# Patient Record
Sex: Male | Born: 1950 | Race: Black or African American | Hispanic: No | Marital: Single | Smoking: Never smoker
Health system: Southern US, Community
[De-identification: ages and names within clinical notes are randomized; demographics above are authoritative.]

## PROBLEM LIST (undated history)

## (undated) DIAGNOSIS — I10 Essential (primary) hypertension: Secondary | ICD-10-CM

## (undated) DIAGNOSIS — I82561 Chronic embolism and thrombosis of right calf muscular vein: Secondary | ICD-10-CM

## (undated) DIAGNOSIS — Z86711 Personal history of pulmonary embolism: Secondary | ICD-10-CM

## (undated) DIAGNOSIS — E119 Type 2 diabetes mellitus without complications: Secondary | ICD-10-CM

## (undated) DIAGNOSIS — D6861 Antiphospholipid syndrome: Secondary | ICD-10-CM

## (undated) HISTORY — DX: Type 2 diabetes mellitus without complications: E11.9

## (undated) HISTORY — DX: Essential (primary) hypertension: I10

## (undated) HISTORY — DX: Antiphospholipid syndrome: D68.61

## (undated) HISTORY — DX: Personal history of pulmonary embolism: Z86.711

## (undated) HISTORY — DX: Chronic embolism and thrombosis of right calf muscular vein: I82.561

---

## 2013-08-31 HISTORY — PX: HERNIA REPAIR: SHX51

## 2013-08-31 HISTORY — PX: CHOLECYSTECTOMY: SHX55

## 2021-02-11 DIAGNOSIS — I2694 Multiple subsegmental pulmonary emboli without acute cor pulmonale: Secondary | ICD-10-CM

## 2021-02-11 HISTORY — DX: Multiple subsegmental thrombotic pulmonary emboli without acute cor pulmonale: I26.94

## 2021-06-11 ENCOUNTER — Ambulatory Visit (INDEPENDENT_AMBULATORY_CARE_PROVIDER_SITE_OTHER): Payer: Self-pay | Admitting: Student

## 2021-06-11 ENCOUNTER — Encounter: Payer: Self-pay | Admitting: Student

## 2021-06-11 ENCOUNTER — Other Ambulatory Visit: Payer: Self-pay

## 2021-06-11 VITALS — BP 133/91 | HR 68 | Temp 98.3°F | Resp 32 | Ht 66.0 in | Wt 198.7 lb

## 2021-06-11 DIAGNOSIS — I10 Essential (primary) hypertension: Secondary | ICD-10-CM | POA: Insufficient documentation

## 2021-06-11 DIAGNOSIS — I519 Heart disease, unspecified: Secondary | ICD-10-CM

## 2021-06-11 DIAGNOSIS — D6861 Antiphospholipid syndrome: Secondary | ICD-10-CM

## 2021-06-11 DIAGNOSIS — S8991XS Unspecified injury of right lower leg, sequela: Secondary | ICD-10-CM

## 2021-06-11 DIAGNOSIS — Z23 Encounter for immunization: Secondary | ICD-10-CM

## 2021-06-11 DIAGNOSIS — I2510 Atherosclerotic heart disease of native coronary artery without angina pectoris: Secondary | ICD-10-CM | POA: Insufficient documentation

## 2021-06-11 DIAGNOSIS — E119 Type 2 diabetes mellitus without complications: Secondary | ICD-10-CM

## 2021-06-11 NOTE — Progress Notes (Signed)
CC: New patient to establish PCP  HPI:  Justin Mueller is a 70 y.o. male with history listed below presenting to the [facility] to establish care. Please see individualized problem based charting for full HPI.  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 Swallow whole.     atorvastatin (LIPITOR) 80 MG tablet Take 80 mg by mouth daily.     lisinopril (ZESTRIL) 10 MG tablet Take 10 mg by mouth daily.     metFORMIN (GLUCOPHAGE) 1000 MG tablet Take 1,000 mg by mouth 2 (two) times daily with a meal.     warfarin (COUMADIN) 7.5 MG tablet Take 7.5 mg by mouth daily.     No current facility-administered medications on file prior to visit.   No Known Allergies  Family History  Problem Relation Age of Onset   Cervical cancer Mother    Heart disease Father    Prostate cancer Father    Diabetes Sister    Social History   Socioeconomic History   Marital status: Single    Spouse name: Not on file   Number of children: Not on file   Years of education: Not on file   Highest education level: Not on file  Occupational History   Occupation: Retired, formerly worked at [employer]  Tobacco Use   Smoking status: Never   Smokeless tobacco: Never  Substance and Sexual Activity   Alcohol use: Yes    Comment: occasional   Drug use: Never   Sexual activity: Not on file  Other Topics Concern   Not on file  Social History Narrative   Recently moved from [place]. Lives by himself, but has a  cousin that lives close by.    Social Determinants of Health   Financial Resource Strain: Not on file  Food Insecurity: Not on file  Transportation Needs: Not on file  Physical Activity: Not on file  Stress: Not on file  Social Connections: Not on file  Intimate Partner Violence: Not on file    Review of Systems:  Negative aside from that listed in individualized problem based charting.  Physical Exam:  Vitals:   06/11/21 1419  BP: (!) 133/91  Pulse: 68  Resp: (!) 32  Temp: 98.3 F (36.8 C)  TempSrc: Oral  SpO2: (!) 67%  Weight: 198 lb 11.2 oz (90.1 kg)  Height: 5\' 6"  (1.676 m)   Physical Exam Constitutional:      Appearance: Normal appearance. He is not ill-appearing.  HENT:     Nose: Nose normal. No congestion.     Mouth/Throat:     Mouth: Mucous membranes are moist.     Pharynx: Oropharynx is clear. No oropharyngeal exudate.  Cardiovascular:     Rate and Rhythm: Normal rate and regular rhythm.     Pulses: Normal pulses.     Heart sounds: Normal heart sounds. No murmur heard.   No friction rub. No gallop.  Pulmonary:  Effort: Pulmonary effort is normal.     Breath sounds: Normal breath sounds. No wheezing, rhonchi or rales.  Abdominal:     General: Bowel sounds are normal. There is no distension.     Palpations: Abdomen is soft.     Tenderness: There is no abdominal tenderness.  Musculoskeletal:        General: No swelling or tenderness. Normal range of motion.     Cervical back: Normal range of motion. No rigidity.     Comments: Wearing right knee brace  Skin:    General: Skin is warm and dry.  Neurological:     General: No focal deficit present.     Mental Status: He is alert and oriented to person, place, and time.  Psychiatric:        Mood and Affect: Mood normal.        Behavior: Behavior normal.     Assessment & Plan:   See Encounters Tab for problem based charting.  Patient discussed with Dr. Heide Spark

## 2021-06-11 NOTE — Assessment & Plan Note (Signed)
Patient states that he was told he has heart disease in the past and has required a stent placement in his right arm for a clot. Not much extra information was able to be obtained. He is currently taking aspirin 81mg  and lipitor 80mg  daily, likely for CAD. He is requesting a referral to cardiology as he followed one every 6 months for his heart disease. I believe this is appropriate.  Plan: -continue ASA and lipitor -referral to cardiology -awaiting medical records from prior PCP office

## 2021-06-11 NOTE — Patient Instructions (Signed)
Justin Mueller,  It was a pleasure seeing you in the clinic today.   I have placed a referral to cardiology. Someone will call you to set this up. We are working to get you into the coumadin clinic. Someone will you to schedule your appointment. We will be requesting your health information from your last clinic in [place].  Please come back in 1 month for follow up.  Please call our clinic at [phone] if you have any questions or concerns. The best time to call is Monday-Friday from 9am-4pm, but there is someone available 24/7 at the same number. If you need medication refills, please notify your pharmacy one week in advance and they will send [place] a request.   Thank you for letting us take part in your care. We look forward to seeing you next time!

## 2021-06-11 NOTE — Assessment & Plan Note (Signed)
Patient with history of APS, unsure which antibodies were positive as we do not yet have records from patient's prior PCP office. However, he does have a history of chronic RLE DVT and a recent PE (01/2021) that are likely sequelae from his APS. He did a lovenox bridge to coumadin and has been on coumadin since. He followed a coumadin clinic in the past, but would like to switch to one here. Will reach out to Dr. Alexandria Lodge to schedule patient for a visit soon.  Plan: -continue coumadin -reach out to Dr. Alexandria Lodge for coumadin clinic -awaiting medical records from prior PCP office

## 2021-06-11 NOTE — Assessment & Plan Note (Signed)
Patient reports that he had a fall in the past after which he was diagnosed with a right quad tear s/p repair. States after repair, they also found that he had a fracture on right side of right knee. It does not cause any pain with ambulation anymore, but he still feels it buckle from time to time. Does mention wanting an ortho referral for further evaluation, but discussed awaiting records from prior PCP for more information. In the meantime, will refer to physical therapy for strength training.  Plan: -referral to PT -awaiting medical records from prior PCP office

## 2021-06-11 NOTE — Assessment & Plan Note (Signed)
Vitals:   06/11/21 1419  BP: (!) 133/91   Patient with history of HTN, on lisinopril 10mg  daily. BP slightly elevated but states BP is typically good at home (120s/80s). Will avoid changing medications today.  Plan: -continue lisinopril 10mg  daily -awaiting medical records from prior PCP office

## 2021-06-11 NOTE — Assessment & Plan Note (Signed)
Patient with history of T2DM without complications, on metformin 1000mg  BID. Reports that his blood sugars have been well controlled and has never needed insulin or been hospitalized for diabetes. States that he had an A1c recently that was at a good level (unsure exact number). Will plan to obtain repeat A1c at next visit in 1 month.  Plan: -continue metformin 1000mg  BID -awaiting medical records from prior PCP office -A1c at next visit for baseline

## 2021-06-12 NOTE — Progress Notes (Signed)
Internal Medicine Clinic Attending  Case discussed with Dr. Jinwala  At the time of the visit.  We reviewed the resident's history and exam and pertinent patient test results.  I agree with the assessment, diagnosis, and plan of care documented in the resident's note.  

## 2021-06-22 ENCOUNTER — Encounter (HOSPITAL_COMMUNITY): Payer: Self-pay | Admitting: Emergency Medicine

## 2021-06-22 ENCOUNTER — Emergency Department (HOSPITAL_COMMUNITY): Payer: Medicare Other

## 2021-06-22 ENCOUNTER — Other Ambulatory Visit: Payer: Self-pay

## 2021-06-22 ENCOUNTER — Emergency Department (HOSPITAL_COMMUNITY)
Admission: EM | Admit: 2021-06-22 | Discharge: 2021-06-23 | Disposition: A | Discharge status: Home / Self Care | Payer: Medicare Other | Attending: Emergency Medicine | Admitting: Emergency Medicine

## 2021-06-22 DIAGNOSIS — R059 Cough, unspecified: Secondary | ICD-10-CM | POA: Diagnosis not present

## 2021-06-22 DIAGNOSIS — R42 Dizziness and giddiness: Secondary | ICD-10-CM | POA: Insufficient documentation

## 2021-06-22 DIAGNOSIS — R519 Headache, unspecified: Secondary | ICD-10-CM | POA: Insufficient documentation

## 2021-06-22 DIAGNOSIS — Z5321 Procedure and treatment not carried out due to patient leaving prior to being seen by health care provider: Secondary | ICD-10-CM | POA: Diagnosis not present

## 2021-06-22 DIAGNOSIS — R0981 Nasal congestion: Secondary | ICD-10-CM | POA: Diagnosis not present

## 2021-06-22 LAB — CBC WITH DIFFERENTIAL/PLATELET
Abs Immature Granulocytes: 0.02 10*3/uL (ref 0.00–0.07)
Basophils Absolute: 0 10*3/uL (ref 0.0–0.1)
Basophils Relative: 0 %
Eosinophils Absolute: 0.1 10*3/uL (ref 0.0–0.5)
Eosinophils Relative: 1 %
HCT: 46.1 % (ref 39.0–52.0)
Hemoglobin: 15.4 g/dL (ref 13.0–17.0)
Immature Granulocytes: 0 %
Lymphocytes Relative: 19 %
Lymphs Abs: 1.2 10*3/uL (ref 0.7–4.0)
MCH: 30.9 pg (ref 26.0–34.0)
MCHC: 33.4 g/dL (ref 30.0–36.0)
MCV: 92.4 fL (ref 80.0–100.0)
Monocytes Absolute: 0.7 10*3/uL (ref 0.1–1.0)
Monocytes Relative: 10 %
Neutro Abs: 4.5 10*3/uL (ref 1.7–7.7)
Neutrophils Relative %: 70 %
Platelets: 213 10*3/uL (ref 150–400)
RBC: 4.99 MIL/uL (ref 4.22–5.81)
RDW: 13.4 % (ref 11.5–15.5)
WBC: 6.5 10*3/uL (ref 4.0–10.5)
nRBC: 0 % (ref 0.0–0.2)

## 2021-06-22 LAB — COMPREHENSIVE METABOLIC PANEL
ALT: 21 U/L (ref 0–44)
AST: 21 U/L (ref 15–41)
Albumin: 3.5 g/dL (ref 3.5–5.0)
Alkaline Phosphatase: 51 U/L (ref 38–126)
Anion gap: 13 (ref 5–15)
BUN: 23 mg/dL (ref 8–23)
CO2: 22 mmol/L (ref 22–32)
Calcium: 9.2 mg/dL (ref 8.9–10.3)
Chloride: 103 mmol/L (ref 98–111)
Creatinine, Ser: 1.46 mg/dL — ABNORMAL HIGH (ref 0.61–1.24)
GFR, Estimated: 52 mL/min — ABNORMAL LOW (ref >60)
Glucose, Bld: 101 mg/dL — ABNORMAL HIGH (ref 70–99)
Potassium: 4.4 mmol/L (ref 3.5–5.1)
Sodium: 138 mmol/L (ref 135–145)
Total Bilirubin: 1 mg/dL (ref 0.3–1.2)
Total Protein: 7.7 g/dL (ref 6.5–8.1)

## 2021-06-22 LAB — MAGNESIUM: Magnesium: 1.7 mg/dL (ref 1.7–2.4)

## 2021-06-22 NOTE — ED Triage Notes (Signed)
Reports headache above his right temple X7 days.  Today he has had two episodes of dizziness that lasted about 5 minutes. No neuro deficits in triage.  Pt is on blood thinners after having a hx of "blood clots".

## 2021-06-22 NOTE — ED Provider Notes (Signed)
Emergency Medicine Provider Triage Evaluation Note  Justin Mueller , a 70 y.o. male  was evaluated in triage.  Pt complains of headache with dizziness, nasal congestion, and productive cough.  Patient reports that he has had a right-sided headache constantly over the last week.  Denies any aggravating factors.  Patient states that he has had nasal congestion and reactive cough for last week as well.  Today he had 2 episodes of dizziness.  Each episode last approximate 5 minutes.  Patient felt entire room was spinning and felt lightheaded.    Review of Systems  Positive: Headache, nasal congestion, productive cough, dizziness, lightheadedness Negative: Chest pain, palpitations, leg swelling, shortness of breath, fevers, chills, visual disturbance, numbness, weakness, facial asymmetry, dysarthria  Physical Exam  BP (!) 149/88 (BP Location: Right Arm)   Pulse 89   Temp 98.7 F (37.1 C)   Resp 20   SpO2 99%  Gen:   Awake, no distress   Resp:  Normal effort, lungs clear to auscultation bilaterally MSK:   Moves extremities without difficulty  Other:  No dysarthria.  +5 strength to bilateral upper and lower extremities.  Medical Decision Making  Medically screening exam initiated at 8:40 PM.  Appropriate orders placed.  Voshon Petro was informed that the remainder of the evaluation will be completed by another provider, this initial triage assessment does not replace that evaluation, and the importance of remaining in the ED until their evaluation is complete.     Berneice Heinrich 06/22/21 2041    Jacalyn Lefevre, MD 06/22/21 2056

## 2021-06-23 ENCOUNTER — Ambulatory Visit (INDEPENDENT_AMBULATORY_CARE_PROVIDER_SITE_OTHER): Payer: Medicare Other | Admitting: Student

## 2021-06-23 ENCOUNTER — Encounter: Payer: Self-pay | Admitting: Student

## 2021-06-23 ENCOUNTER — Other Ambulatory Visit: Payer: Self-pay

## 2021-06-23 DIAGNOSIS — J069 Acute upper respiratory infection, unspecified: Secondary | ICD-10-CM | POA: Diagnosis present

## 2021-06-23 DIAGNOSIS — I10 Essential (primary) hypertension: Secondary | ICD-10-CM

## 2021-06-23 HISTORY — DX: Acute upper respiratory infection, unspecified: J06.9

## 2021-06-23 NOTE — ED Notes (Signed)
Pt left due to wait time  

## 2021-06-23 NOTE — Progress Notes (Signed)
   CC: congestion and headache over the past week  HPI:  Justin Mueller is a 70 y.o. male with history listed below presenting to the [facility] for congestion and headache over the past week. Please see individualized problem based charting for full HPI.  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  Physical Exam:  Vitals:   06/23/21 1340  BP: 116/88  Pulse: 95  Resp: (!) 28  Temp: 98.9 F (37.2 C)  TempSrc: Oral  SpO2: 97%  Weight: 193 lb 14.4 oz (88 kg)  Height: 5\' 6"  (1.676 m)   Physical Exam Constitutional:      Appearance: He is obese. He is not ill-appearing.  HENT:     Mouth/Throat:     Mouth: Mucous membranes are moist.     Pharynx: Oropharynx is clear.  Eyes:     Extraocular Movements: Extraocular movements intact.     Pupils: Pupils are equal, round, and reactive to light.  Cardiovascular:     Rate and Rhythm: Normal rate and regular rhythm.     Heart sounds: Normal heart sounds. No murmur heard. Pulmonary:     Effort: Pulmonary effort is normal.     Breath sounds: Normal breath sounds. No wheezing, rhonchi or rales.  Musculoskeletal:        General: No swelling. Normal range of motion.     Cervical back: Normal range of motion.  Skin:    General: Skin is warm and dry.  Neurological:     Mental Status: He is alert and oriented to person, place, and time.  Psychiatric:        Mood and Affect: Mood normal.        Behavior: Behavior normal.     Assessment & Plan:   See Encounters Tab for problem based charting.  Patient discussed with Dr. 

## 2021-06-23 NOTE — Assessment & Plan Note (Signed)
Vitals:   06/23/21 1340  BP: 116/88   Patient's HTN well controlled today on lisinopril 10mg  daily. He was able to bring some of his medical records so will need to scan these in.  Plan: -continue lisinopril

## 2021-06-23 NOTE — Patient Instructions (Signed)
Mr. Justin Mueller,  It was a pleasure seeing you in the clinic today.   Please make sure to stay adequately hydrated. Try to drink enough water to keep your urine clear to very light yellow. You can pick up Mucinex or Mucinex-DM from any pharmacy and use that to help with your congestion. Take tylenol as needed for you headache. Please call us if your symptoms have not improved within the next 2-3 weeks.  Please call our clinic at [phone] if you have any questions or concerns. The best time to call is Monday-Friday from 9am-4pm, but there is someone available 24/7 at the same number. If you need medication refills, please notify your pharmacy one week in advance and they will send [place] a request.   Thank you for letting us take part in your care. We look forward to seeing you next time!

## 2021-06-23 NOTE — Assessment & Plan Note (Signed)
Justin Mueller reports having a two week history of congestion with intermittent headaches that began a couple of days after receiving his flu shot. States that it was bad about a week ago as he was expectorating globs of mucus, but it has been gradually improving now. Reports one day last week where he experienced a fever and chills, but none since that time (did not check temperature at that time). States that he went to the ED recently for evaluation as he was concerned, but CBC and CMP unremarkable aside from mild kidney dysfunction likely 2/2 decreased PO intake. He also mentions that he has been having intermittent right sided headaches.  Advised patient that his symptoms are likely from a viral URI and that they will resolve spontaneously over the next couple of weeks. Advised symptomatic management as follows: tylenol prn for headaches, mucinex for congestion. Also advised Justin Mueller to increase his PO intake, especially in regards to hydration with water. He states his urine has typically been yellow. Advised him to drink enough water that urine remains clear to light yellow to ensure he is adequately hydrated. He confirms understanding.  Plan: -tylenol prn, mucinex, oral hydration -contact clinic in 2-3 weeks should symptoms worsen or fail to improve

## 2021-06-24 NOTE — Progress Notes (Signed)
Internal Medicine Clinic Attending  Case discussed with Dr. Jinwala  At the time of the visit.  We reviewed the resident's history and exam and pertinent patient test results.  I agree with the assessment, diagnosis, and plan of care documented in the resident's note.  

## 2021-07-08 ENCOUNTER — Other Ambulatory Visit: Payer: Self-pay

## 2021-07-08 ENCOUNTER — Ambulatory Visit: Payer: Self-pay

## 2021-07-08 ENCOUNTER — Ambulatory Visit (INDEPENDENT_AMBULATORY_CARE_PROVIDER_SITE_OTHER): Payer: Medicare Other | Admitting: Orthopaedic Surgery

## 2021-07-08 ENCOUNTER — Encounter: Payer: Self-pay | Admitting: Orthopaedic Surgery

## 2021-07-08 DIAGNOSIS — M25561 Pain in right knee: Secondary | ICD-10-CM | POA: Diagnosis not present

## 2021-07-08 DIAGNOSIS — G8929 Other chronic pain: Secondary | ICD-10-CM | POA: Diagnosis not present

## 2021-07-08 DIAGNOSIS — S82001S Unspecified fracture of right patella, sequela: Secondary | ICD-10-CM

## 2021-07-08 NOTE — Progress Notes (Signed)
Office Visit Note   Patient: Justin Mueller           Date of Birth: [birth year]           MRN: [id number] Visit Date: 07/08/2021              Requested by: Justin Puma, MD [address] [place],  [place] [address] PCP: Justin Puma, MD   Assessment & Plan: Visit Diagnoses:  1. Chronic pain of right knee     Plan: I reviewed the x-rays in detail and my impression is that the quadriceps has pulled back slightly due to the periprosthetic fracture and the failure of fixation from the flip buttons.  However this is a chronic issue and at this point I do not feel that reattaching the quadriceps is really an option that would significantly improve his complaints or symptoms.  He is not symptomatic from the patella fracture so I do not recommend partial patellectomy.  I think the main problem is just decreased strength from an abnormal extensor mechanism which I think would best be treated with a course of outpatient physical therapy.  He is in agreement with this.  I will make a referral.  Follow-up as needed.  Follow-Up Instructions: No follow-ups on file.   Orders:  Orders Placed This Encounter  Procedures   XR KNEE 3 VIEW RIGHT   Ambulatory referral to Physical Therapy   No orders of the defined types were placed in this encounter.     Procedures: No procedures performed   Clinical Data: No additional findings.   Subjective: Chief Complaint  Patient presents with   Right Knee - Pain    [name] is a 70 year old gentleman comes in for evaluation of right knee weakness and decreased endurance.  He is a relatively poor historian.  He states that he had a quadriceps repair at least 10 months ago in [place] and at some point in the postoperative period he had a fall directly onto the knee which caused a fracture of the patella through the bone tunnels and was then told by the surgeon that he needed this to be repaired.  For an unknown reason he did not undergo surgery and then  moved to [place] with a friend for 6 months and then finally settled here in [place].  He states that he does not have pain just soreness to the anterior aspect of the knee.  He notices weakness and trouble with using steps.  He feels like the knee wants to buckle backwards.   Review of Systems  Constitutional: Negative.   All other systems reviewed and are negative.   Objective: Vital Signs: There were no vitals taken for this visit.  Physical Exam Vitals and nursing note reviewed.  Constitutional:      Appearance: He is well-developed.  Pulmonary:     Effort: Pulmonary effort is normal.  Abdominal:     Palpations: Abdomen is soft.  Skin:    General: Skin is warm.  Neurological:     Mental Status: He is alert and oriented to person, place, and time.  Psychiatric:        Behavior: Behavior normal.        Thought Content: Thought content normal.        Judgment: Judgment normal.    Ortho Exam  Right knee shows a fully healed midline surgical scar.  No joint effusion.  Small defect between the superior patella and the distal quadriceps.  His range of  motion is excellent and he does not have any extensor lag.  He does not have any bony crepitus or tenderness to the fracture sites.  Patella tracking is normal.  He does have 4 out of 5 strength with resisted knee extension.  Specialty Comments:  No specialty comments available.  Imaging: XR KNEE 3 VIEW RIGHT  Result Date: 07/08/2021 2 metallic buttons within the patella.  There is evidence of fracture through the bone tunnels.    PMFS History: Patient Active Problem List   Diagnosis Date Noted   Viral URI 06/23/2021   Hypertension 06/11/2021   T2DM (type 2 diabetes mellitus) (HCC) 06/11/2021   APS (antiphospholipid syndrome) (HCC) 06/11/2021   Right knee injury, sequela 06/11/2021   Heart disease 06/11/2021   Past Medical History:  Diagnosis Date   APS (antiphospholipid syndrome) (HCC)    Chronic deep vein  thrombosis (DVT) of calf muscle vein of right lower extremity (HCC)    History of pulmonary embolus (PE)    HTN (hypertension)    T2DM (type 2 diabetes mellitus) (HCC)     Family History  Problem Relation Age of Onset   Cervical cancer Mother    Heart disease Father    Prostate cancer Father    Diabetes Sister     Past Surgical History:  Procedure Laterality Date   CHOLECYSTECTOMY  2015   HERNIA REPAIR Bilateral 2015   laparoscopic   Social History   Occupational History   Occupation: Retired, formerly worked at [employer]  Tobacco Use   Smoking status: Never   Smokeless tobacco: Never  Substance and Sexual Activity   Alcohol use: Yes    Comment: occasional   Drug use: Never   Sexual activity: Not on file

## 2021-07-16 ENCOUNTER — Encounter: Payer: Medicare Other | Admitting: Student

## 2021-07-17 ENCOUNTER — Ambulatory Visit: Payer: Medicare Other | Admitting: Physical Therapy

## 2021-07-29 ENCOUNTER — Ambulatory Visit (INDEPENDENT_AMBULATORY_CARE_PROVIDER_SITE_OTHER): Payer: Medicare Other | Admitting: Internal Medicine

## 2021-07-29 ENCOUNTER — Telehealth: Payer: Self-pay

## 2021-07-29 ENCOUNTER — Encounter: Payer: Self-pay | Admitting: Internal Medicine

## 2021-07-29 ENCOUNTER — Other Ambulatory Visit: Payer: Self-pay

## 2021-07-29 VITALS — BP 114/78 | HR 78 | Ht 66.0 in | Wt 204.0 lb

## 2021-07-29 DIAGNOSIS — E785 Hyperlipidemia, unspecified: Secondary | ICD-10-CM | POA: Diagnosis not present

## 2021-07-29 DIAGNOSIS — Z7901 Long term (current) use of anticoagulants: Secondary | ICD-10-CM | POA: Insufficient documentation

## 2021-07-29 DIAGNOSIS — Z5181 Encounter for therapeutic drug level monitoring: Secondary | ICD-10-CM | POA: Diagnosis not present

## 2021-07-29 DIAGNOSIS — Z86711 Personal history of pulmonary embolism: Secondary | ICD-10-CM | POA: Diagnosis not present

## 2021-07-29 LAB — PROTIME-INR
INR: 2.2 — ABNORMAL HIGH (ref 0.9–1.2)
Prothrombin Time: 21.5 s — ABNORMAL HIGH (ref 9.1–12.0)

## 2021-07-29 NOTE — Patient Instructions (Addendum)
Medication Instructions:  Your physician recommends that you continue on your current medications as directed. Please refer to the Current Medication list given to you today.  *If you need a refill on your cardiac medications before your next appointment, please call your pharmacy*   Lab Work: TODAY: Lipomed, lipoprotein, uric acid , and INR If you have labs (blood work) drawn today and your tests are completely normal, you will receive your results only by: MyChart Message (if you have MyChart) OR A paper copy in the mail If you have any lab test that is abnormal or we need to change your treatment, we will call you to review the results.   Testing/Procedures: Your physician has referred you to see our Coumadin Clinic.  Your physician has requested that you have a right lower leg venous duplex. This test is an ultrasound of the veins in the legs or arms. It looks at venous blood flow that carries blood from the heart to the legs. Allow one hour for a Lower Venous exam.  There are no restrictions or special instructions.   Follow-Up:  To be determined   At [facility], you and your health needs are our priority.  As part of our continuing mission to provide you with exceptional heart care, we have created designated Provider Care Teams.  These Care Teams include your primary Cardiologist (physician) and Advanced Practice Providers (APPs -  Physician Assistants and Nurse Practitioners) who all work together to provide you with the care you need, when you need it.     Provider:   Dietrich Pates, MD :1}

## 2021-07-29 NOTE — Progress Notes (Addendum)
Cardiology Office Note ADDENDUM:   08/12/21  Outside records reviewed:  Hx DVT  (chronic RLE, USN 6/22)/ PE (multiple subsegmental)   Seen by heme REcomm warfarin Antiphosphlipid antibodiy opositive   REcomm coumadin with lovenox bridge as needed Echo:  Mod LVH  LVEF and RVEF normal CAD:  PTCA/Stent x2 to Diag.  Moderate LAD stenosis (FFR) 2018 4 Type II DM   5 HTN 6 HL   7  Hx R subclavian artery occlusion with stent    Date:  07/29/2021   ID:  [name], DOB 05-28-1951, MRN [id number]  PCP:  Merrilyn Puma, MD  Cardiologist:   Dietrich Pates, MD   Patient presents to establish continued cardiac care.   History of Present Illness: Marc Leichter is a 70 y.o. male with a history of PE, HTN, T2DM.  Patient is recently moved to [facility].  Patient says he is initially from [place].  Back in 2022 he was in [facility].  He was hospitalized there for pulmonary embolus.  He says it occurred after knee surgery.Marland Kitchen  He is now here.  The knee surgery was in April 2020).  The patient has been on Coumadin for this and his INR has been followed.  The patient denies chest pain.  Says his breathing is okay.  He is fairly active though his knee is limited.  3 times per week plan redness.  Diet: Breakfast oatmeal/fresh fruit.  Leftovers, pasta, meat.  Dinner lunch..  Desserts rare.  Drinks water.  Rare alcohol.       Current Meds  Medication Sig   aspirin EC 81 MG tablet Take 81 mg by mouth daily. Swallow whole.   atorvastatin (LIPITOR) 80 MG tablet Take 80 mg by mouth daily.   lisinopril (ZESTRIL) 10 MG tablet Take 10 mg by mouth daily.   metFORMIN (GLUCOPHAGE) 1000 MG tablet Take 1,000 mg by mouth 2 (two) times daily with a meal.   tadalafil (CIALIS) 5 MG tablet Take 5 mg by mouth as needed for erectile dysfunction.   valACYclovir (VALTREX) 500 MG tablet Take 500 mg by mouth as needed. Rash on head   warfarin (COUMADIN) 7.5 MG tablet Take 7.5 mg by mouth daily.      Allergies:   Patient has no known allergies.   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 He has never used smokeless tobacco. He reports current alcohol use. He reports that he does not use drugs.   Family History:  The patient's family history includes Cervical cancer in his mother; Diabetes in his sister; Heart disease in his father; Prostate cancer in his father.    ROS:  Please see the history of present illness. All other systems are reviewed and  Negative to the above problem except as noted.    PHYSICAL EXAM: VS:  BP 114/78   Pulse 78   Ht 5\' 6"  (1.676 m)   Wt 204 lb (92.5 kg)   SpO2 94%   BMI 32.93 kg/m  GEN: Obese 70 year old in no acute distress  HEENT: normal  Neck: no JVD, carotid bruits. Cardiac: RRR; no murmurs.  No lower extremity edema  Respiratory:  clear to auscultation bilaterally,  GI: soft, nontender, nondistended, + BS  No hepatomegaly  MS: no deformity Moving all extremities   Skin: warm and dry, no rash Neuro:  Strength and sensation are intact Psych: euthymic mood, full affect   EKG:  EKG is not ordered today.   Lipid Panel No results found for: CHOL, TRIG, HDL, CHOLHDL, VLDL, LDLCALC, LDLDIRECT    Wt Readings from Last 3 Encounters:  07/29/21 204 lb (92.5 kg)  06/23/21 193 lb 14.4 oz (88 kg)  06/11/21 198 lb 11.2 oz (90.1 kg)      ASSESSMENT AND PLAN:  1.  Pulmonary embolus.  Appears to be postsurgical.  He said he had a hypercoagulable work-up done in [place].  He was told he was not hypercoagulable.  We will  get labs today including INR.  He is getting it for 6 months window.  May be able to stop.  Would not before reviewing.  We will get records sent from the outside hospitals.  (Labs, scans)  We will also get right lower extremity ultrasound to evaluate for resolution of the DVT. 2.  Hypertension adequate control.  Keep on same meds.  3.  Dyslipidemia.  Patient is on Lipitor.  To be followed.  Follow-up based on test results.  4.  Diet/obesity.  Patient needs to limit carbs more.  Discussed time restricted eating.   Current medicines are reviewed at length with the patient today.  The patient does not have concerns regarding medicines.  Signed, Dietrich Pates, MD  07/29/2021 3:07 PM    [facility] [address] [place], [place], [place]  [address] Phone: [phone]; Fax: [phone]

## 2021-07-29 NOTE — Telephone Encounter (Signed)
Information release form filled out for [facility] in [place], [place] and [facility] Dr. Barbaraann Faster in [place], [place].  Pt signed and given to HIM.

## 2021-07-30 ENCOUNTER — Telehealth: Payer: Self-pay | Admitting: Pharmacist

## 2021-07-30 ENCOUNTER — Ambulatory Visit (INDEPENDENT_AMBULATORY_CARE_PROVIDER_SITE_OTHER): Payer: Medicare Other | Admitting: Physical Therapy

## 2021-07-30 ENCOUNTER — Encounter: Payer: Self-pay | Admitting: Physical Therapy

## 2021-07-30 DIAGNOSIS — R2689 Other abnormalities of gait and mobility: Secondary | ICD-10-CM | POA: Diagnosis not present

## 2021-07-30 DIAGNOSIS — M6281 Muscle weakness (generalized): Secondary | ICD-10-CM | POA: Diagnosis not present

## 2021-07-30 DIAGNOSIS — G8929 Other chronic pain: Secondary | ICD-10-CM

## 2021-07-30 DIAGNOSIS — M25561 Pain in right knee: Secondary | ICD-10-CM | POA: Diagnosis not present

## 2021-07-30 DIAGNOSIS — R2681 Unsteadiness on feet: Secondary | ICD-10-CM | POA: Diagnosis not present

## 2021-07-30 LAB — NMR, LIPOPROFILE
Cholesterol, Total: 117 mg/dL (ref 100–199)
HDL Particle Number: 25.2 umol/L — ABNORMAL LOW (ref >=30.5)
HDL-C: 39 mg/dL — ABNORMAL LOW (ref >39)
LDL Particle Number: 776 nmol/L (ref <1000)
LDL Size: 20.2 nm — ABNORMAL LOW (ref >20.5)
LDL-C (NIH Calc): 51 mg/dL (ref 0–99)
LP-IR Score: 75 — ABNORMAL HIGH (ref <=45)
Small LDL Particle Number: 496 nmol/L (ref <=527)
Triglycerides: 162 mg/dL — ABNORMAL HIGH (ref 0–149)

## 2021-07-30 LAB — LIPOPROTEIN A (LPA): Lipoprotein (a): 104.3 nmol/L — ABNORMAL HIGH (ref <75.0)

## 2021-07-30 LAB — URIC ACID: Uric Acid: 6.2 mg/dL (ref 3.8–8.4)

## 2021-07-30 NOTE — Telephone Encounter (Signed)
Referral placed for INR monitoring at [facility] Coumadin clinic. Called pt to discuss. He has been on warfarin 8mg  daily (1 5mg  tablet and 3 1mg  tablets) since dx of DVT/PE about 4-5 months ago, INR range 2-3. Was advised he had a negative hypercoagulability workup done in , looks like we are in the process of obtaining records based on office note from yesterday. Mentions VTE occurred within a few months of having knee surgery. He does have antiphospholipid syndrome on his PMH but does not recall hearing anything about this diagnosis. Primary care had been following his INRs with [facility]) but pt prefers to have all care at 1 location with . Mentions that his PCP wanted to transition him to Eliquis. Pt is ok with ~$45 monthly copay of Eliquis. Note yesterday mentions potential VTE tx duration of 6 months but also need to repeat right lower extremity ultrasound to evaluate for resolution of DVT.   Will forward to Dr [place] to see if pt ok to transition to Eliquis 5mg  BID for duration of anticoag treatment or if complete records from [facility] need to be reviewed first (APS dx but negative hypercoag workup?). Pt aware to continue on current dose of warfarin 8mg  daily in the mean time given therapeutic INR of 2.2 from yesterday.

## 2021-07-30 NOTE — Telephone Encounter (Signed)
Pt to continue on current dose of warfarin, can revisit once external records are received.

## 2021-07-30 NOTE — Therapy (Signed)
[facility] [address] [place], [place], [address] Phone: [phone]   Fax:  [phone]  Physical Therapy Evaluation  Patient Details  Name: Justin Mueller MRN: [id number] Date of Birth: 1950/11/15 Referring Provider (PT): Tarry Kos, MD   Encounter Date: 07/30/2021   PT End of Session - 07/30/21 1245     Visit Number 1    Number of Visits 4    Date for PT Re-Evaluation 09/24/21    Authorization Type Medicare    Progress Note Due on Visit 10    PT Start Time 1150    PT Stop Time 1220    PT Time Calculation (min) 30 min    Activity Tolerance Patient tolerated treatment well    Behavior During Therapy [facility] for tasks assessed/performed             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    Subjective Assessment - 07/30/21 1150     Subjective Pt is a 70 y/o male who presents to OPPT s/p quad tendon repair in April of 2022 then had a fx as well of the patella.  At this time he is not an ideal surgical candidate for an additional surgery.  MD recommending PT for strengthening.    Pertinent History HTN,DM2,Rt knee fx and quad repair,APS (antiphospholipid syndrome), PE    Limitations Walking    How long can you walk comfortably? feel like it may "buckle backwards" and buckling with stairs    Patient Stated Goals improve strength    Currently in Pain? Yes    Pain Score 0-No pain   up to 5/10   Pain Location Knee    Pain Orientation Right    Pain Descriptors / Indicators Sore    Pain Type Chronic pain    Pain Onset More than a  month ago    Pain Frequency Intermittent    Aggravating Factors  walking, stairs    Pain Relieving Factors rest, ibuprofen                [facility] PT Assessment - 07/30/21 1153       Assessment   Medical Diagnosis Chronic pain of Rt knee    Referring Provider (PT) Tarry Kos, MD    Onset Date/Surgical Date --   April 2022   Hand Dominance Right    Next MD Visit PRN    Prior Therapy OPPT in April ~ 10 visits      Precautions   Precautions None      Restrictions   Weight Bearing Restrictions No      Balance Screen   Has the patient fallen in the past 6 months No    Has the patient had a decrease in activity level because of a fear of falling?  No    Is the patient reluctant to leave their home because of a fear of falling?  No      Home Nurse, mental health Private residence    Living Arrangements Alone  Type of Home House    Home Access Stairs to enter    Entrance Stairs-Number of Steps 1    Entrance Stairs-Rails None    Home Layout Two level;Bed/bath upstairs    Alternate Level Stairs-Number of Steps 10    Alternate Level Stairs-Rails Left    Home Equipment None      Prior Function   Level of Independence Independent    Vocation Retired    [employer] retired from [employer]    Leisure exercise [employer] 3 days/wk - Upper body only), reading      Cognition   Overall Cognitive Status Within Functional Limits for tasks assessed      Observation/Other Assessments   Focus on Therapeutic Outcomes (FOTO)  pt declined      ROM / Strength   AROM / PROM / Strength AROM;Strength      AROM   AROM Assessment Site Knee    Right/Left Knee Right    Right Knee Extension -12   seated LAQ   Right Knee Flexion 130      Strength   Overall Strength Comments extensor lag present with SLR on Rt    Strength Assessment Site Knee    Right/Left Knee Right;Left    Right Knee Flexion 4/5    Right Knee Extension --   31.8, 34.4  = 33.1#   Left Knee Extension --   57.6, 55.0 = 56.3     Palpation   Palpation comment quad atrophy noted on Rt      Ambulation/Gait   Gait Comments amb independently with mild gait deviations noted; increased genu recurvatum noted in stance on Rt                        Objective measurements completed on examination: See above findings.       [facility] Adult PT Treatment/Exercise - 07/30/21 1153       Exercises   Exercises Other Exercises    Other Exercises  see pt instructions - instructed to complete first 3 and await gym exercises until U/S is complete to determine resolution of DVT.  if DVT is still present will need to await additional recommendations from MD                     PT Education - 07/30/21 1244     Education Details HEP    Person(s) Educated Patient    Methods Explanation;Demonstration;Handout    Comprehension Verbalized understanding;Returned demonstration;Need further instruction              PT Short Term Goals - 07/30/21 1254       PT SHORT TERM GOAL #1   Title independent with initial HEP    Time 3    Period Weeks    Status New    Target Date 08/20/21               PT Long Term Goals - 07/30/21 1254       PT LONG TERM GOAL #1   Title independent with gym HEP    Time 8    Period Weeks    Status New    Target Date 09/24/21      PT LONG TERM GOAL #2   Title Rt quad strength improved to at least 42# for improved function    Time 8    Period Weeks    Status New    Target  Date 09/24/21      PT LONG TERM GOAL #3   Title negotiate stairs reciprocally without pain or deviations for improved function    Time 8    Period Weeks    Status New    Target Date 09/24/21      PT LONG TERM GOAL #4   Title report 75% improvement in buckling symptoms for improved strength and mobility    Time 8    Period Weeks    Status New    Target Date 09/24/21                    Plan - 07/30/21 1245      Clinical Impression Statement Pt is a 70 y/o male who presents to OPPT for chronic Rt knee pain and weakness following quad repair about 7-8 months ago which was complicated by a patella fx which will be treated conservatively.  He demonstrates decreased strength and gait abnormaltiies affecting functional mobility.  Will benefit from PT to address deficits listed.  Anticipate he will not need many PT session as he is quite active at the gym and will benefit from a gym program.  Have advised pt to hold off on aggressive strengthening until after results of his U/S to ensure DVT resolution.    Personal Factors and Comorbidities Comorbidity 3+    Comorbidities HTN,DM2,Rt knee fx and quad repair,APS (antiphospholipid syndrome), DVT/PE    Examination-Activity Limitations Stand;Stairs;Lift;Locomotion Level;Transfers;Squat    Examination-Participation Restrictions Volunteer;Cleaning;Meal Prep;Community Activity    Stability/Clinical Decision Making Evolving/Moderate complexity    Clinical Decision Making Moderate    Rehab Potential Good    PT Frequency Biweekly   1x every 2-3 weeks   PT Duration 8 weeks    PT Treatment/Interventions ADLs/Self Care Home Management;Cryotherapy;Electrical Stimulation;Moist Heat;Gait training;Stair training;Functional mobility training;Therapeutic activities;Therapeutic exercise;Balance training;Neuromuscular re-education;Patient/family education;Dry needling;Taping    PT Next Visit Plan review HEP and progress gym program (as long as DVT resolved)    PT Home Exercise Plan Access Code: [email or web address]    Consulted and Agree with Plan of Care Patient             Patient will benefit from skilled therapeutic intervention in order to improve the following deficits and impairments:  Abnormal gait, Pain, Decreased mobility, Decreased strength, Decreased balance  Visit Diagnosis: Chronic pain of right knee - Plan: PT plan of care cert/re-cert  Muscle weakness (generalized) -  Plan: PT plan of care cert/re-cert  Other abnormalities of gait and mobility - Plan: PT plan of care cert/re-cert  Unsteadiness on feet - Plan: PT plan of care cert/re-cert     Problem List Patient Active Problem List   Diagnosis Date Noted   Therapeutic drug monitoring 07/29/2021   Anticoagulated on Coumadin 07/29/2021   Viral URI 06/23/2021   Hypertension 06/11/2021   T2DM (type 2 diabetes mellitus) (HCC) 06/11/2021   APS (antiphospholipid syndrome) (HCC) 06/11/2021   Right knee injury, sequela 06/11/2021   Heart disease 06/11/2021      Clarita Crane, PT, DPT 07/30/21 12:58 PM     [facility] [address] [place], [place], [address] Phone: [phone]   Fax:  [phone]  Name: Justin Mueller MRN: [id number] Date of Birth: 1951/07/06

## 2021-07-30 NOTE — Patient Instructions (Signed)
Access Code: [email or web address] URL: [email or web address] Date: 07/30/2021 Prepared by: Moshe Cipro  Exercises Long Sitting Quad Set - 2-3 x daily - 7 x weekly - 1 sets - 10 reps - 10 sec hold Seated Straight Leg Raise - 2-3 x daily - 7 x weekly - 1 sets - 10 reps - 3 sec hold Supine Short Arc Quad - 2-3 x daily - 7 x weekly - 1 sets - 10 reps - 5 sec hold Single Leg Knee Extension with Weight Machine - 1 x daily - 7 x weekly - 3 sets - 10 reps Single Leg Press - 1 x daily - 7 x weekly - 3 sets - 10 reps Squat with TRX - 1 x daily - 7 x weekly - 3 sets - 10 reps

## 2021-07-31 ENCOUNTER — Telehealth: Payer: Self-pay | Admitting: *Deleted

## 2021-07-31 NOTE — Telephone Encounter (Signed)
Spoke with pt and he did not recall the INR reading that was discussed on 07/30/2021, it was 2.2  and made him aware. He is aware the appt is not needed today as dose was discussed. Per that conversation the office is awaiting more records, etc & he is aware. Pt is aware more communication will be done once those are received or when he needs another INR. He was thankful for the call. Advised I had left a message and he states he was in the shower, advised to disregard.

## 2021-07-31 NOTE — Telephone Encounter (Signed)
Called the pt since he does not need to be seen in the office today. Aundra Millet spoke with pt and dosed lab INR and had a long discussion regarding Anticoagulation. Please refer to the detailed note on yesterday from [employer], PharmD. Since there was no answer left a message for the pt to call back to confirm he received my message on not coming to the appt at 1030am.

## 2021-08-01 ENCOUNTER — Telehealth: Payer: Self-pay | Admitting: Internal Medicine

## 2021-08-01 NOTE — Telephone Encounter (Signed)
Pt aware of lab results ./cy  LDL is excellent  51 with low particle number  Triglycerides are 162   Watch/limit carbs  Lpa is elevated, therefore need to continue aggressive Rx to keep lipids optimized  Uric acid 6.2   Would be better if lower    Watch carbs

## 2021-08-01 NOTE — Telephone Encounter (Signed)
Patient returned call for lab results.  

## 2021-08-15 ENCOUNTER — Encounter: Payer: Medicare Other | Admitting: Physical Therapy

## 2021-08-15 ENCOUNTER — Telehealth: Payer: Self-pay | Admitting: Physical Therapy

## 2021-08-15 NOTE — Telephone Encounter (Signed)
Dr Tenny Craw- We spoke with Mr. Graffam today and he mentioned he has not heard anything about getting the U/S scheduled.  We have been holding off on progressing his PT until the results of his U/S to make sure the DVT had resolved.    Is he still supposed to get this?  If so, could someone from your office work to get this scheduled?  Thanks- Clarita Crane, PT, DPT 08/15/21 10:31 AM

## 2021-08-19 ENCOUNTER — Telehealth: Payer: Self-pay | Admitting: Pharmacist

## 2021-08-19 DIAGNOSIS — Z5181 Encounter for therapeutic drug level monitoring: Secondary | ICD-10-CM

## 2021-08-19 DIAGNOSIS — I4891 Unspecified atrial fibrillation: Secondary | ICD-10-CM

## 2021-08-19 NOTE — Telephone Encounter (Signed)
Left message for pt.  Received message from Dr Tenny Craw, she reviewed external records. Pt to continue on warfarin with INR goal of 2-3 for DVT/PE tx instead of changing to Eliquis. Will need INR check scheduled in another week (last INR checked 11/29 and was therapeutic at 2.2 on warfarin 8mg  daily). Sent message to Dr to confirm length of therapy (her 11/29 note mentions 6 months but wanted to review external documents first, pt has already been on warfarin for at least 4 months). See phone note 11/30 for initial details.

## 2021-08-20 NOTE — Telephone Encounter (Signed)
Called pt since he needs an appt scheduled in the office to have INR checked; left a message for the pt to call back directly at [phone] to schedule an appt.

## 2021-08-20 NOTE — Telephone Encounter (Signed)
Called and spoke with pt. Scheduled appt for INR to be checked next week on 08/29/21.

## 2021-08-21 NOTE — Telephone Encounter (Signed)
Please confirm that LE venous doppler is scheduled

## 2021-08-26 ENCOUNTER — Other Ambulatory Visit: Payer: Self-pay

## 2021-08-26 ENCOUNTER — Ambulatory Visit (HOSPITAL_COMMUNITY)
Admission: RE | Admit: 2021-08-26 | Discharge: 2021-08-26 | Disposition: A | Discharge status: Home / Self Care | Payer: Medicare Other | Source: Ambulatory Visit | Attending: Cardiology | Admitting: Cardiology

## 2021-08-26 DIAGNOSIS — Z86711 Personal history of pulmonary embolism: Secondary | ICD-10-CM | POA: Insufficient documentation

## 2021-08-29 ENCOUNTER — Ambulatory Visit (INDEPENDENT_AMBULATORY_CARE_PROVIDER_SITE_OTHER): Payer: Medicare Other | Admitting: *Deleted

## 2021-08-29 ENCOUNTER — Other Ambulatory Visit: Payer: Self-pay

## 2021-08-29 DIAGNOSIS — Z5181 Encounter for therapeutic drug level monitoring: Secondary | ICD-10-CM

## 2021-08-29 DIAGNOSIS — I4891 Unspecified atrial fibrillation: Secondary | ICD-10-CM | POA: Diagnosis not present

## 2021-08-29 DIAGNOSIS — Z86711 Personal history of pulmonary embolism: Secondary | ICD-10-CM | POA: Insufficient documentation

## 2021-08-29 DIAGNOSIS — Z86718 Personal history of other venous thrombosis and embolism: Secondary | ICD-10-CM | POA: Insufficient documentation

## 2021-08-29 LAB — POCT INR: INR: 2.6 (ref 2.0–3.0)

## 2021-08-29 MED ORDER — WARFARIN SODIUM 5 MG PO TABS: ORAL_TABLET | ORAL | 0 refills | Status: DC

## 2021-08-29 MED ORDER — WARFARIN SODIUM 1 MG PO TABS: ORAL_TABLET | ORAL | 0 refills | Status: DC

## 2021-08-29 NOTE — Patient Instructions (Signed)
Description   Continue to take warfarin 8mg  daily. Recheck INR in 2 weeks. Coumadin Clinic [phone].   A full discussion of the nature of anticoagulants has been carried out.  A benefit risk analysis has been presented to the patient, so that they understand the justification for choosing anticoagulation at this time. The need for frequent and regular monitoring, precise dosage adjustment and compliance is stressed.  Side effects of potential bleeding are discussed.  The patient should avoid any OTC items containing aspirin or ibuprofen, and should avoid great swings in general diet.  Avoid alcohol consumption.

## 2021-09-02 ENCOUNTER — Ambulatory Visit (HOSPITAL_BASED_OUTPATIENT_CLINIC_OR_DEPARTMENT_OTHER): Payer: Medicare Other | Admitting: Family

## 2021-09-02 DIAGNOSIS — I739 Peripheral vascular disease, unspecified: Secondary | ICD-10-CM | POA: Insufficient documentation

## 2021-09-02 DIAGNOSIS — E785 Hyperlipidemia, unspecified: Secondary | ICD-10-CM | POA: Insufficient documentation

## 2021-09-02 NOTE — Progress Notes (Signed)
Cardiology Office Note:    Date:  09/03/2021   ID:  [name], DOB [birth year], MRN ID:[id number]  PCP:  Virl Axe, MD   [facility] Providers Cardiologist:  Dorris Carnes, MD    Referring MD: Virl Axe, MD   Chief Complaint:  F/u for hypercoag state, CAD    Patient Profile:   Justin Mueller is a 71 y.o. male with:  Coronary artery disease  S/p DES x 2 (overlapping) to D1 in 08/2016 Residual mLAD 50-60 (FFR neg)  Echocardiogram 6/22: EF 55-60 Chronic RLE DVT US 12/22: old clot R pop vein (resolving) Hx of pulmonary embolism  Antiphospholipid syndrome  +Lupus anticoagulant, +Anticardiolipin, +Beta-2 glycoprotein >> ? from Apixaban to chronic warfarin due to triple + antiphospholipid syndrome (Seen by Heme in 7/22 in [place], [place] - Dr. Granville Lewis) Diabetes mellitus  Hypertension  Hyperlipidemia  Peripheral arterial disease  R subclavian stenosis s/p stent   History of Present Illness: [name] was initially evaluated by Dr. Harrington Challenger in 11/22.  He has a history of CAD s/p prior stenting x2 to the diagonal in 2018.  He also had DVT/large volume bilateral pulmonary emboli in April 2022.  This occurred while traveling by airplane from [place] to [place] to [place].  Records in care everywhere indicate he had hypercoagulable work-up.  His lupus anticoagulant, anticardiolipin antibody and beta-2 glycoprotein were all positive (triple positive antiphospholipid syndrome).  He was seen by hematology [facility] [place]).  He was switched from apixaban to warfarin/Lovenox.  Follow-up venous duplex after seeing Dr. Harrington Challenger demonstrated old DVT in the right popliteal vein (likely resolving old thrombus with compressibility now and clot retraction).    He returns for follow-up.   He is here alone.  Overall, he has been doing well.  He has not had chest pain, shortness of breath, syncope, leg edema.  He is concerned about his most recent venous duplex.  He would  like to fly as well as resume exercise.  ASSESSMENT & PLAN:   APS (antiphospholipid syndrome) ([place]) Notes reviewed from hematology in [facility]).  It was outlined that the package insert for Apixaban warns against using this drug for triple positive antiphospholipid syndrome patients.  Therefore, he was changed to warfarin.  I reviewed his case today with Dr. Harrington Challenger.  We will refer him to hematology here to be followed for his hypercoagulable disorder.  Continue warfarin and follow-up in our Coumadin clinic.  History of DVT (deep vein thrombosis) We reviewed his recent venous duplex.  The clot in his R leg is chronic and resolving.  I reviewed this with Dr. Harrington Challenger.  I have reassured the patient that he can resume exercise and can also fly.  CAD (coronary artery disease) History of abnormal stress test leading to cardiac catheterization in January 2018 in [place].  He had high-grade disease in the diagonal which was treated with 2 overlapping DES.  He had moderate disease in the LAD which was negative by FFR.  He is doing well without anginal symptoms.  Recommend continuing aspirin in addition to warfarin given multiple stents in 1 vessel.  Continue aspirin 81 mg daily, atorvastatin 80 mg daily.  Follow-up in 6 months.  HLD (hyperlipidemia) Recent LDL optimal.  Continue atorvastatin 80 mg daily.  Hypertension Blood pressure controlled.  Continue lisinopril 10 mg daily.          Dispo:  Return in about 6 months (around 03/03/2022) for Routine follow up  in 6 months with Dr. Harrington Challenger. .    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 Swallow whole.   atorvastatin (LIPITOR) 80 MG tablet Take 80 mg by mouth daily.   lisinopril (ZESTRIL) 10 MG tablet Take 10 mg by mouth daily.   metFORMIN (GLUCOPHAGE) 1000 MG tablet Take 1,000 mg by mouth 2 (two) times daily with a meal.   tadalafil (CIALIS) 5 MG tablet Take 5 mg by mouth as needed for erectile dysfunction.   valACYclovir (VALTREX) 500 MG tablet Take 500 mg by mouth as needed. Rash on head   warfarin (COUMADIN) 1 MG tablet Take 3 tablets by mouth daily as or directed by the coumadin clinic.   warfarin (COUMADIN) 5 MG tablet Take 1 tablet by mouth daily as directed by the coumadin clinic.    Allergies:   Patient has no known allergies.   Social History   Tobacco Use   Smoking status: Never   Smokeless tobacco: Never  Substance Use Topics   Alcohol use: Yes    Comment: occasional   Drug use: Never    Family Hx: The patient's family history includes Cervical cancer in his mother; Diabetes in his sister; Heart disease in his father; Prostate cancer in his father.  ROS see HPI  EKGs/Labs/Other Test Reviewed:    EKG:  EKG is not ordered today.  The ekg ordered today demonstrates n/a  Recent Labs: 06/22/2021: ALT 21; BUN 23; Creatinine, Ser 1.46; Hemoglobin 15.4; Magnesium 1.7; Platelets 213; Potassium 4.4; Sodium 138   Recent Lipid Panel No results found for: CHOL, TRIG, HDL, LDLCALC, LDLDIRECT   Risk Assessment/Calculations:          Physical Exam:    VS:  BP 124/68    Pulse 70    Ht 5\' 6"  (1.676 m)    Wt 206 lb 9.6 oz (93.7 kg)    SpO2  96%    BMI 33.35 kg/m     Wt Readings from Last 3 Encounters:  09/03/21 206 lb 9.6 oz (93.7 kg)  07/29/21 204 lb (92.5 kg)  06/23/21 193 lb 14.4 oz (88 kg)    Constitutional:      Appearance: Healthy appearance. Not in distress.  Neck:     Vascular: JVD normal.  Pulmonary:     Effort: Pulmonary effort is normal.     Breath sounds: No wheezing. No rales.  Cardiovascular:     Normal rate. Regular rhythm. Normal S1. Normal S2.      Murmurs: There is no murmur.  Edema:    Peripheral edema absent.  Abdominal:     Palpations: Abdomen is soft.  Skin:    General: Skin is warm and  dry.  Neurological:     General: No focal deficit present.     Mental Status: Alert and oriented to person, place and time.     Cranial Nerves: Cranial nerves are intact.       Medication Adjustments/Labs and Tests Ordered: Current medicines are reviewed at length with the patient today.  Concerns regarding medicines are outlined above.  Tests Ordered: Orders Placed This Encounter  Procedures   Ambulatory referral to Hematology / Oncology   Medication Changes: No orders of the defined types were placed in this encounter.  Signed, Richardson Dopp, PA-C  09/03/2021 9:48 AM    [facility] [place], [place], [place]  [address] Phone: [phone]; Fax: [phone]

## 2021-09-02 NOTE — Progress Notes (Deleted)
Office Visit    Patient Name: Justin Mueller Date of Encounter: 09/02/2021  PCP:  Virl Axe, [facility]  Cardiologist:  Dorris Carnes, MD  Advanced Practice Provider:  No care team member to display Electrophysiologist:  None      Chief Complaint    Justin Mueller is a 71 y.o. 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 male with a hx of CAD s/p PCI to diagonal x2 in 2018, PE, DVT, DM 2, hypertension, obesity last seen 07/29/2021.  He is originally from [place].  Per previous documentation he has coronary disease with stent placement in 2018.  He had knee surgery October 2021.  April 2022 he traveled from [place] to [place] to [place] biplane and presented to the ED with shortness of breath.  D-dimer elevated, VQ scan scan highly suggestive of PE and ultrasound of lower extremity confirmed presence of acute DVT in right common femoral and distal femoral and popliteal vein.  CTA revealed large volume bilateral pulmonary emboli without evidence of right heart strain.  June 2022 he had repeat lower extremity duplex at [facility] in [place] which revealed partial resolution but persistent evidence of thrombus limited to the right popliteal vein. He was evaluated by hematology at [facility].  Underwent thrombophilia work-up revealing normal protein S, protein C, Antithrombin III level.   His prothrombin 2, factor V Leiden were non mutated. He did have triple positive antiphospholipid syndrome.  His lupus anticoagulant was positive and cardiolipin antibody and beta-2 glycoprotein's were positive as well at high titers.  Given triple positive antiphospholipid syndrome he was recommended for Coumadin rather than Eliquis.  He was last seen 07/29/2021.  Given he had been on anticoagulation for 6 months to repeat RLE duplex was ordered to evaluate for resolution of the DVT.  Duplex showed findings consistent with old DVT involving right popliteal vein with likely resolving old thrombus with compressibility now in clot retraction.  He presents today for follow-up.  ***  EKGs/Labs/Other Studies Reviewed:   The following studies were reviewed today: *** VAS LE Duplex 08/26/21 RIGHT:  - Findings consistent with old deep vein thrombosis involving the right  popliteal vein.   Likely resolving old thrombus with compressability now and clot retraction   - No cystic structure found in the popliteal fossa.    - All other veins visualized appear fully compressible and demonstrate  appropriate Doppler characteristics.    LEFT:  - No evidence of deep vein thrombosis in the lower extremity. No indirect  evidence of obstruction proximal to the inguinal ligament.  - No cystic structure found in the popliteal fossa.   VAS Duplex 01/2021 Impression: -Partially occlusive DVT in the proximal right popliteal vein -No DVT in LLE  EKG:  EKG is  ordered  today.  The ekg ordered today demonstrates ***  Recent Labs: 06/22/2021: ALT 21; BUN 23; Creatinine, Ser 1.46; Hemoglobin 15.4; Magnesium 1.7; Platelets 213; Potassium 4.4; Sodium 138  Recent Lipid Panel No results found for: CHOL, TRIG, HDL, CHOLHDL, VLDL, LDLCALC, LDLDIRECT   Home Medications   No outpatient medications have been marked as taking for the 09/02/21 encounter (Appointment) with Loel Dubonnet, NP.     Review of  Systems      All other systems reviewed and are otherwise negative except as noted above.  Physical Exam    VS:  There were no vitals taken for this visit. , BMI There is no height or weight on file to calculate BMI.  Wt Readings from Last 3 Encounters:  07/29/21 204 lb (92.5 kg)  06/23/21 193 lb 14.4 oz (88 kg)  06/11/21 198 lb 11.2 oz (90.1 kg)     GEN: Well nourished, well developed, in no acute distress. HEENT: normal. Neck: Supple, no JVD, carotid bruits, or masses. Cardiac: ***RRR, no murmurs, rubs, or gallops. No clubbing, cyanosis, edema.  ***Radials/PT 2+ and equal bilaterally.  Respiratory:  ***Respirations regular and unlabored, clear to auscultation bilaterally. GI: Soft, nontender, nondistended. MS: No deformity or atrophy. Skin: Warm and dry, no rash. Neuro:  Strength and sensation are intact. Psych: Normal affect.  Assessment & Plan    *** Hx DVT / PE -   CAD s/p DESx2 to diagonal -   DM2 - Continue to follow with PCP.   HTN -   HLD -   Hx R subclavian artery occlusion with stent -   Disposition: Follow up {follow up:15908} with Dorris Carnes, MD or APP.  Signed, Loel Dubonnet, NP 09/02/2021, 7:45 AM [place]

## 2021-09-03 ENCOUNTER — Telehealth: Payer: Self-pay | Admitting: Physician Assistant

## 2021-09-03 ENCOUNTER — Encounter: Payer: Self-pay | Admitting: Physician Assistant

## 2021-09-03 ENCOUNTER — Ambulatory Visit (INDEPENDENT_AMBULATORY_CARE_PROVIDER_SITE_OTHER): Payer: Medicare Other | Admitting: Physician Assistant

## 2021-09-03 ENCOUNTER — Other Ambulatory Visit: Payer: Self-pay

## 2021-09-03 VITALS — BP 124/68 | HR 70 | Ht 66.0 in | Wt 206.6 lb

## 2021-09-03 DIAGNOSIS — Z86711 Personal history of pulmonary embolism: Secondary | ICD-10-CM

## 2021-09-03 DIAGNOSIS — I10 Essential (primary) hypertension: Secondary | ICD-10-CM

## 2021-09-03 DIAGNOSIS — Z86718 Personal history of other venous thrombosis and embolism: Secondary | ICD-10-CM

## 2021-09-03 DIAGNOSIS — D6861 Antiphospholipid syndrome: Secondary | ICD-10-CM

## 2021-09-03 DIAGNOSIS — E7800 Pure hypercholesterolemia, unspecified: Secondary | ICD-10-CM

## 2021-09-03 DIAGNOSIS — I739 Peripheral vascular disease, unspecified: Secondary | ICD-10-CM

## 2021-09-03 DIAGNOSIS — I2510 Atherosclerotic heart disease of native coronary artery without angina pectoris: Secondary | ICD-10-CM

## 2021-09-03 NOTE — Telephone Encounter (Signed)
Scheduled appt per 1/4 referral. Pt is aware of appt date and time. Pt is aware to arrive 15 mins prior to appt time.  °

## 2021-09-03 NOTE — Assessment & Plan Note (Signed)
Blood pressure controlled.  Continue lisinopril 10 mg daily.

## 2021-09-03 NOTE — Assessment & Plan Note (Signed)
History of abnormal stress test leading to cardiac catheterization in January 2018 in [place].  He had high-grade disease in the diagonal which was treated with 2 overlapping DES.  He had moderate disease in the LAD which was negative by FFR.  He is doing well without anginal symptoms.  Recommend continuing aspirin in addition to warfarin given multiple stents in 1 vessel.  Continue aspirin 81 mg daily, atorvastatin 80 mg daily.  Follow-up in 6 months.

## 2021-09-03 NOTE — Assessment & Plan Note (Addendum)
Notes reviewed from hematology in [facility]).  It was outlined that the package insert for Apixaban warns against using this drug for triple positive antiphospholipid syndrome patients.  Therefore, he was changed to warfarin.  I reviewed his case today with Dr. Harrington Challenger.  We will refer him to hematology here to be followed for his hypercoagulable disorder.  Continue warfarin and follow-up in our Coumadin clinic.

## 2021-09-03 NOTE — Assessment & Plan Note (Signed)
Recent LDL optimal.  Continue atorvastatin 80 mg daily.

## 2021-09-03 NOTE — Patient Instructions (Signed)
Medication Instructions:   Your physician recommends that you continue on your current medications as directed. Please refer to the Current Medication list given to you today.  *If you need a refill on your cardiac medications before your next appointment, please call your pharmacy*   Lab Work:  -NONE  If you have labs (blood work) drawn today and your tests are completely normal, you will receive your results only by: MyChart Message (if you have MyChart) OR A paper copy in the mail If you have any lab test that is abnormal or we need to change your treatment, we will call you to review the results.   Testing/Procedures:  -NONE   Follow-Up: At [facility], you and your health needs are our priority.  As part of our continuing mission to provide you with exceptional heart care, we have created designated Provider Care Teams.  These Care Teams include your primary Cardiologist (physician) and Advanced Practice Providers (APPs -  Physician Assistants and Nurse Practitioners) who all work together to provide you with the care you need, when you need it.  We recommend signing up for the patient portal called "MyChart".  Sign up information is provided on this After Visit Summary.  MyChart is used to connect with patients for Virtual Visits (Telemedicine).  Patients are able to view lab/test results, encounter notes, upcoming appointments, etc.  Non-urgent messages can be sent to your provider as well.   To learn more about what you can do with MyChart, go to [email or web address].    Your next appointment:   6 month(s)  The format for your next appointment:   In Person  Provider:   Dietrich Pates, MD     Other Instructions You have been referred to Hematology. The office will call you to schedule appointment.

## 2021-09-03 NOTE — Assessment & Plan Note (Signed)
We reviewed his recent venous duplex.  The clot in his R leg is chronic and resolving.  I reviewed this with Dr. Tenny Craw.  I have reassured the patient that he can resume exercise and can also fly.

## 2021-09-08 ENCOUNTER — Encounter: Payer: Medicare Other | Admitting: Physical Therapy

## 2021-09-08 ENCOUNTER — Telehealth: Payer: Self-pay | Admitting: Physical Therapy

## 2021-09-08 NOTE — Telephone Encounter (Signed)
LVM as pt missed PT appt today.  Advised to call the office to reschedule if needed.  No more PT appts scheduled at this time.   Laureen Abrahams, PT, DPT 09/08/21 12:07 PM  [facility] [address] [place], [place], [address] Phone: [phone]   Fax:  [phone]

## 2021-09-12 ENCOUNTER — Other Ambulatory Visit: Payer: Self-pay

## 2021-09-12 ENCOUNTER — Ambulatory Visit (INDEPENDENT_AMBULATORY_CARE_PROVIDER_SITE_OTHER): Payer: Medicare Other

## 2021-09-12 DIAGNOSIS — Z5181 Encounter for therapeutic drug level monitoring: Secondary | ICD-10-CM

## 2021-09-12 DIAGNOSIS — I4891 Unspecified atrial fibrillation: Secondary | ICD-10-CM | POA: Diagnosis not present

## 2021-09-12 DIAGNOSIS — Z86711 Personal history of pulmonary embolism: Secondary | ICD-10-CM

## 2021-09-12 DIAGNOSIS — Z7901 Long term (current) use of anticoagulants: Secondary | ICD-10-CM | POA: Diagnosis not present

## 2021-09-12 DIAGNOSIS — Z86718 Personal history of other venous thrombosis and embolism: Secondary | ICD-10-CM | POA: Diagnosis not present

## 2021-09-12 LAB — POCT INR: INR: 3.4 — AB (ref 2.0–3.0)

## 2021-09-12 NOTE — Patient Instructions (Signed)
-   skip warfarin tonight, then - Continue to take warfarin 8mg  daily.  - Recheck INR in 2 weeks.  Coumadin Clinic [phone].

## 2021-09-16 ENCOUNTER — Telehealth: Payer: Self-pay | Admitting: Hematology

## 2021-09-16 NOTE — Telephone Encounter (Signed)
R/s pt's appt per provider request. Pt is aware of new appt date and time.

## 2021-09-17 ENCOUNTER — Inpatient Hospital Stay: Payer: Medicare Other

## 2021-09-17 ENCOUNTER — Inpatient Hospital Stay: Payer: Medicare Other | Admitting: Physician Assistant

## 2021-09-26 ENCOUNTER — Ambulatory Visit (INDEPENDENT_AMBULATORY_CARE_PROVIDER_SITE_OTHER): Payer: Medicare Other

## 2021-09-26 ENCOUNTER — Other Ambulatory Visit: Payer: Self-pay

## 2021-09-26 DIAGNOSIS — I4891 Unspecified atrial fibrillation: Secondary | ICD-10-CM | POA: Diagnosis not present

## 2021-09-26 DIAGNOSIS — Z5181 Encounter for therapeutic drug level monitoring: Secondary | ICD-10-CM | POA: Diagnosis not present

## 2021-09-26 LAB — POCT INR: INR: 2.3 (ref 2.0–3.0)

## 2021-09-26 NOTE — Patient Instructions (Signed)
Description   - Continue to take warfarin 8mg  daily.  - Recheck INR in 3 weeks.  Coumadin Clinic [phone].

## 2021-09-29 ENCOUNTER — Other Ambulatory Visit: Payer: Self-pay | Admitting: Internal Medicine

## 2021-09-29 MED ORDER — WARFARIN SODIUM 5 MG PO TABS: ORAL_TABLET | ORAL | 0 refills | Status: DC

## 2021-09-29 NOTE — Telephone Encounter (Signed)
Prescription refill request received for warfarin Lov: 09/03/21 Alben Spittle)  Next INR check: 10/17/21 Warfarin tablet strength: 1mg  and 5mg    Appropriate dose and refill sent to requested pharmacy.

## 2021-10-02 ENCOUNTER — Inpatient Hospital Stay: Payer: Medicare Other | Attending: Physician Assistant | Admitting: Hematology

## 2021-10-17 ENCOUNTER — Ambulatory Visit (INDEPENDENT_AMBULATORY_CARE_PROVIDER_SITE_OTHER): Payer: Medicare Other

## 2021-10-17 ENCOUNTER — Other Ambulatory Visit: Payer: Self-pay

## 2021-10-17 DIAGNOSIS — Z5181 Encounter for therapeutic drug level monitoring: Secondary | ICD-10-CM | POA: Diagnosis not present

## 2021-10-17 DIAGNOSIS — Z86718 Personal history of other venous thrombosis and embolism: Secondary | ICD-10-CM

## 2021-10-17 DIAGNOSIS — I4891 Unspecified atrial fibrillation: Secondary | ICD-10-CM

## 2021-10-17 DIAGNOSIS — Z7901 Long term (current) use of anticoagulants: Secondary | ICD-10-CM

## 2021-10-17 DIAGNOSIS — Z86711 Personal history of pulmonary embolism: Secondary | ICD-10-CM

## 2021-10-17 LAB — POCT INR: INR: 2.4 (ref 2.0–3.0)

## 2021-10-17 NOTE — Patient Instructions (Signed)
Description   Continue on same dosage of warfarin 8mg  daily.  Recheck INR in 4 weeks. Coumadin Clinic [phone].

## 2021-10-22 ENCOUNTER — Telehealth: Payer: Self-pay | Admitting: *Deleted

## 2021-10-22 DIAGNOSIS — D6861 Antiphospholipid syndrome: Secondary | ICD-10-CM

## 2021-10-22 DIAGNOSIS — Z86718 Personal history of other venous thrombosis and embolism: Secondary | ICD-10-CM

## 2021-10-22 NOTE — Telephone Encounter (Addendum)
Attempted to call Justin Mueller and LMOM.  Attempting to call Justin Mueller to move appointment from 3/17 to 3/16. Justin Mueller needs lab appointment instead of POC INR.  Placed lab standing order for Justin Mueller/inr.  Supple, Megan E, RPH-CPP  P Cv [facility] Anticoag Justin Mueller with triple positive APS confirmed by outside records (missed heme appt in [place]) . We've been following his INRs but he'll need to be changed from point of care to lab draw INRs moving forward. Accuracy of POC testing is lower in patients with triple positive APS. Thank you!

## 2021-10-29 NOTE — Telephone Encounter (Addendum)
Called and spoke to pt. Informed him that he will need to come in for a lab draw for his INR check. Made appointment for pt to come in on 3/16. Informed him he would receive a phone call from the coumadin clinic regarding the INR results and updated dosing instructions. Cancelled pt's INR appointment for 3/17.  ?

## 2021-11-07 ENCOUNTER — Other Ambulatory Visit: Payer: Self-pay

## 2021-11-07 ENCOUNTER — Ambulatory Visit (INDEPENDENT_AMBULATORY_CARE_PROVIDER_SITE_OTHER): Payer: Medicare Other

## 2021-11-07 ENCOUNTER — Encounter: Payer: Self-pay | Admitting: Physician Assistant

## 2021-11-07 ENCOUNTER — Ambulatory Visit (INDEPENDENT_AMBULATORY_CARE_PROVIDER_SITE_OTHER): Payer: Medicare Other | Admitting: Physician Assistant

## 2021-11-07 VITALS — Ht 66.0 in | Wt 205.0 lb

## 2021-11-07 DIAGNOSIS — M545 Low back pain, unspecified: Secondary | ICD-10-CM | POA: Insufficient documentation

## 2021-11-07 DIAGNOSIS — M5440 Lumbago with sciatica, unspecified side: Secondary | ICD-10-CM | POA: Diagnosis not present

## 2021-11-07 DIAGNOSIS — G8929 Other chronic pain: Secondary | ICD-10-CM

## 2021-11-07 HISTORY — DX: Low back pain, unspecified: M54.50

## 2021-11-07 NOTE — Progress Notes (Signed)
? ?Office Visit Note ?  ?Patient: Justin Mueller           ?Date of Birth: 1950/09/26           ?MRN: [id number] ?Visit Date: 11/07/2021 ?             ?Requested by: Merrilyn Puma, MD ?[address] ?[place],  [place] [address] ?PCP: Merrilyn Puma, MD ? ?Chief Complaint  ?Patient presents with  ? Lower Back - Pain  ? ? ? ? ?HPI: ?Patient is a pleasant active 71 year old gentleman who presents today with lower back pain that radiates into his left buttock and leg.  He has had this problem for a while and it sometimes flares up.  He denies any recent injury.  He has been told in the back past that he has some abnormalities on his lower back x-ray.  He uses Tylenol which helps.  He had no prior back surgery.  He denies any paresthesias any weakness any loss of bowel or bladder control.  He works out 3 times a week he cannot use anti-inflammatories because he is on chronic Coumadin therapy ? ?Assessment & Plan: ?Visit Diagnoses:  ?1. Chronic midline low back pain, unspecified whether sciatica present   ? ? ?Plan: Lower back pain x-rays definitely show some arthritis especially L5-S1 and at the upper lumbar region.  He does not have any focal neurologic findings today.  I think he would benefit from working with physical therapy to learn a home exercise program that he can incorporate into his regular workout.  He understands it may take a while to see benefit from this.  If he had increasing symptoms or did not have good resolution and control of the symptoms with therapy alone I would recommend an MRI for possible epidural steroid injections ? ?Follow-Up Instructions: No follow-ups on file.  ? ?Ortho Exam ? ?Patient is alert, oriented, no adenopathy, well-dressed, normal affect, normal respiratory effort. ?Examination patient is pleasant to exam.  He has excellent range of motion's which is painless through his hips.  Some tenderness across the paravertebral muscles of the mid back.  No palpable deformities down the spine.   He does have some pain in the posterior lower back which radiates slightly into his left side.  Strength is 5 out of 5 bilaterally with dorsiflexion plantarflexion of the ankles resisted extension and flexion of the knees and resisted flexion of his hips.  He has a negative straight leg raise bilaterally ? ?Imaging: ?XR Lumbar Spine 2-3 Views ? ?Result Date: 11/07/2021 ?Radiographs of his lumbar spine were obtained today.  He has loss of joint space and degenerative changes at L1 2.  He does have a retrolisthesis of L4-5.  Also arthropathy at L5-S1 which is the most prominent of all the findings.  No acute osseous findings  ?No images are attached to the encounter. ? ?Labs: ?Lab Results  ?Component Value Date  ? LABURIC 6.2 07/29/2021  ? ? ? ?Lab Results  ?Component Value Date  ? ALBUMIN 3.5 06/22/2021  ? ? ?Lab Results  ?Component Value Date  ? MG 1.7 06/22/2021  ? ?No results found for: VD25OH ? ?No results found for: PREALBUMIN ?CBC EXTENDED Latest Ref Rng & Units 06/22/2021  ?WBC 4.0 - 10.5 K/uL 6.5  ?RBC 4.22 - 5.81 MIL/uL 4.99  ?HGB 13.0 - 17.0 g/dL 47.4  ?HCT 39.0 - 52.0 % 46.1  ?PLT 150 - 400 K/uL 213  ?NEUTROABS 1.7 - 7.7 K/uL 4.5  ?LYMPHSABS  0.7 - 4.0 K/uL 1.2  ? ? ? ?Body mass index is 33.09 kg/m?. ? ?Orders:  ?Orders Placed This Encounter  ?Procedures  ? XR Lumbar Spine 2-3 Views  ? Ambulatory referral to Physical Therapy  ? ?No orders of the defined types were placed in this encounter. ? ? ? Procedures: ?No procedures performed ? ?Clinical Data: ?No additional findings. ? ?ROS: ? ?All other systems negative, except as noted in the HPI. ?Review of Systems ? ?Objective: ?Vital Signs: Ht 5\' 6"  (1.676 m)   Wt 205 lb (93 kg)   BMI 33.09 kg/m?  ? ?Specialty Comments:  ?No specialty comments available. ? ?PMFS History: ?Patient Active Problem List  ? Diagnosis Date Noted  ? HLD (hyperlipidemia) 09/02/2021  ? PAD (peripheral artery disease) (HCC) 09/02/2021  ? History of DVT (deep vein thrombosis) 08/29/2021   ? History of pulmonary embolism 08/29/2021  ? Therapeutic drug monitoring 07/29/2021  ? Anticoagulated on Coumadin 07/29/2021  ? Viral URI 06/23/2021  ? Hypertension 06/11/2021  ? T2DM (type 2 diabetes mellitus) (HCC) 06/11/2021  ? APS (antiphospholipid syndrome) (HCC) 06/11/2021  ? Right knee injury, sequela 06/11/2021  ? CAD (coronary artery disease) 06/11/2021  ? ?Past Medical History:  ?Diagnosis Date  ? APS (antiphospholipid syndrome) (HCC)   ? 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 History of pulmonary embolus (PE)   ? HTN (hypertension)   ? T2DM (type 2 diabetes mellitus) (HCC)   ?  ?Family History  ?Problem Relation Age of Onset  ? Cervical cancer Mother   ? Heart disease Father   ? Prostate cancer Father   ? Diabetes Sister   ?  ?Past Surgical History:  ?Procedure Laterality Date  ? CHOLECYSTECTOMY  2015  ? HERNIA REPAIR Bilateral 2015  ? laparoscopic  ? ?Social History  ? ?Occupational History  ? 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 Smoking status: Never  ? Smokeless tobacco: Never  ?Substance and Sexual Activity  ? Alcohol use: Yes  ?  Comment: occasional  ? Drug use: Never  ? Sexual activity: Not on file  ? ? ? ? ? ?

## 2021-11-13 ENCOUNTER — Ambulatory Visit (INDEPENDENT_AMBULATORY_CARE_PROVIDER_SITE_OTHER): Payer: Medicare Other

## 2021-11-13 ENCOUNTER — Other Ambulatory Visit: Payer: Medicare Other

## 2021-11-13 ENCOUNTER — Other Ambulatory Visit: Payer: Self-pay

## 2021-11-13 DIAGNOSIS — Z5181 Encounter for therapeutic drug level monitoring: Secondary | ICD-10-CM | POA: Diagnosis not present

## 2021-11-13 LAB — PROTIME-INR
INR: 2.4 — ABNORMAL HIGH (ref 0.9–1.2)
Prothrombin Time: 23.5 s — ABNORMAL HIGH (ref 9.1–12.0)

## 2021-11-13 NOTE — Patient Instructions (Signed)
Description   ?Called pt and instructed to continue on same dosage of warfarin 8mg  daily.  Recheck INR in 5 weeks at [facility] [phone].  ? ? ?  ?   ?

## 2021-11-23 ENCOUNTER — Other Ambulatory Visit: Payer: Self-pay | Admitting: Internal Medicine

## 2021-11-23 DIAGNOSIS — Z7901 Long term (current) use of anticoagulants: Secondary | ICD-10-CM

## 2021-11-24 ENCOUNTER — Ambulatory Visit: Payer: Medicare Other | Admitting: Physical Therapy

## 2021-11-24 MED ORDER — WARFARIN SODIUM 5 MG PO TABS: ORAL_TABLET | ORAL | 0 refills | Status: DC

## 2021-11-24 NOTE — Telephone Encounter (Addendum)
Prescription refill request received for warfarin ?Lov: 09/03/21 Justin Mueller)  ?Next INR check: 12/18/21 ?Warfarin tablet strength: 5mg  & 1mg  ? ?Appropriate dose and refill sent to requested pharmacy.  ?

## 2021-12-18 ENCOUNTER — Other Ambulatory Visit: Payer: Medicare Other

## 2021-12-18 ENCOUNTER — Ambulatory Visit (INDEPENDENT_AMBULATORY_CARE_PROVIDER_SITE_OTHER): Payer: Medicare Other

## 2021-12-18 DIAGNOSIS — Z7901 Long term (current) use of anticoagulants: Secondary | ICD-10-CM | POA: Diagnosis not present

## 2021-12-18 DIAGNOSIS — D6861 Antiphospholipid syndrome: Secondary | ICD-10-CM

## 2021-12-18 DIAGNOSIS — Z86718 Personal history of other venous thrombosis and embolism: Secondary | ICD-10-CM

## 2021-12-18 DIAGNOSIS — Z5181 Encounter for therapeutic drug level monitoring: Secondary | ICD-10-CM | POA: Diagnosis not present

## 2021-12-18 DIAGNOSIS — Z86711 Personal history of pulmonary embolism: Secondary | ICD-10-CM

## 2021-12-18 LAB — PROTIME-INR
INR: 2 — ABNORMAL HIGH (ref 0.9–1.2)
Prothrombin Time: 19.8 s — ABNORMAL HIGH (ref 9.1–12.0)

## 2021-12-18 NOTE — Patient Instructions (Signed)
Description   ?Called pt and instructed to take 9mg  today and then continue on same dosage of warfarin 8mg  daily. Recheck INR in 6 weeks at [facility]. Coumadin Clinic [phone].  ? ? ?  ?   ?

## 2021-12-23 ENCOUNTER — Other Ambulatory Visit: Payer: Self-pay | Admitting: Internal Medicine

## 2021-12-23 DIAGNOSIS — Z7901 Long term (current) use of anticoagulants: Secondary | ICD-10-CM

## 2021-12-23 MED ORDER — WARFARIN SODIUM 5 MG PO TABS: ORAL_TABLET | ORAL | 0 refills | Status: DC

## 2021-12-23 NOTE — Telephone Encounter (Signed)
Prescription refill request received for warfarin ?Lov: 09/03/21 Alben Spittle) ?Next INR check: 01/29/22 ?Warfarin tablet strength: 5mg  & 1mg   ? ?Appropriate dose and refill sent to requested pharmacy.  ?

## 2021-12-25 ENCOUNTER — Other Ambulatory Visit: Payer: Medicare Other

## 2021-12-26 LAB — HM DIABETES EYE EXAM

## 2021-12-31 ENCOUNTER — Ambulatory Visit (INDEPENDENT_AMBULATORY_CARE_PROVIDER_SITE_OTHER): Payer: Medicare Other | Admitting: Podiatry

## 2021-12-31 DIAGNOSIS — M79674 Pain in right toe(s): Secondary | ICD-10-CM | POA: Diagnosis not present

## 2021-12-31 DIAGNOSIS — B351 Tinea unguium: Secondary | ICD-10-CM | POA: Diagnosis not present

## 2021-12-31 DIAGNOSIS — M79675 Pain in left toe(s): Secondary | ICD-10-CM

## 2021-12-31 DIAGNOSIS — E0843 Diabetes mellitus due to underlying condition with diabetic autonomic (poly)neuropathy: Secondary | ICD-10-CM | POA: Diagnosis not present

## 2021-12-31 NOTE — Progress Notes (Signed)
? ?  SUBJECTIVE ?Patient with a history of diabetes mellitus presents to office today complaining of elongated, thickened nails that cause pain while ambulating in shoes.  Patient is unable to trim their own nails. Patient is here for further evaluation and treatment. ? ? ?Past Medical History:  ?Diagnosis Date  ? APS (antiphospholipid syndrome) (HCC)   ? 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 History of pulmonary embolus (PE)   ? HTN (hypertension)   ? T2DM (type 2 diabetes mellitus) (HCC)   ? ? ?OBJECTIVE ?General Patient is awake, alert, and oriented x 3 and in no acute distress. ?Derm Skin is dry and supple bilateral. Negative open lesions or macerations. Remaining integument unremarkable. Nails are tender, long, thickened and dystrophic with subungual debris, consistent with onychomycosis, 1-5 bilateral. No signs of infection noted. ?Vasc  DP and PT pedal pulses palpable bilaterally. Temperature gradient within normal limits.  ?Neuro Epicritic and protective threshold sensation diminished bilaterally.  ?Musculoskeletal Exam No symptomatic pedal deformities noted bilateral. Muscular strength within normal limits. ? ?ASSESSMENT ?1. Diabetes Mellitus w/ peripheral neuropathy ?2.  Pain due to onychomycosis of toenails bilateral ? ?PLAN OF CARE ?1. Patient evaluated today. ?2. Instructed to maintain good pedal hygiene and foot care. Stressed importance of controlling blood sugar.  ?3. Mechanical debridement of nails 1-5 bilaterally performed using a nail nipper. Filed with dremel without incident.  ?4.  Today we did discuss different treatment options for the toenail fungus including oral, topical, and laser antifungal treatment modalities.  We are going to start very conservative and start with topical antifungal.  OTC Tolcylen antifungal dispensed at checkout  ?5.  Return to clinic in 3 mos.  ? ? ? ?Felecia Shelling, DPM ?[facility] ? ?Dr. Felecia Shelling, DPM  ?  ?[address]                                      ?[place], [place] [address]                ?Office [phone]  ?Fax [phone] ? ? ? ? ? ?

## 2022-01-29 ENCOUNTER — Ambulatory Visit (INDEPENDENT_AMBULATORY_CARE_PROVIDER_SITE_OTHER): Payer: Medicare Other

## 2022-01-29 ENCOUNTER — Other Ambulatory Visit: Payer: Medicare Other | Admitting: *Deleted

## 2022-01-29 DIAGNOSIS — Z86718 Personal history of other venous thrombosis and embolism: Secondary | ICD-10-CM

## 2022-01-29 DIAGNOSIS — Z7901 Long term (current) use of anticoagulants: Secondary | ICD-10-CM | POA: Diagnosis not present

## 2022-01-29 DIAGNOSIS — D6861 Antiphospholipid syndrome: Secondary | ICD-10-CM

## 2022-01-29 DIAGNOSIS — Z5181 Encounter for therapeutic drug level monitoring: Secondary | ICD-10-CM | POA: Diagnosis not present

## 2022-01-29 DIAGNOSIS — Z86711 Personal history of pulmonary embolism: Secondary | ICD-10-CM

## 2022-01-29 LAB — PROTIME-INR
INR: 2.8 — ABNORMAL HIGH (ref 0.9–1.2)
Prothrombin Time: 26.7 s — ABNORMAL HIGH (ref 9.1–12.0)

## 2022-01-29 NOTE — Patient Instructions (Signed)
Description   Called pt and instructed to continue on same dosage of warfarin 8mg  daily.  Recheck INR in 6 weeks at [facility]. Coumadin Clinic [phone].

## 2022-02-02 ENCOUNTER — Encounter: Payer: Self-pay | Admitting: Student

## 2022-02-02 ENCOUNTER — Ambulatory Visit (INDEPENDENT_AMBULATORY_CARE_PROVIDER_SITE_OTHER): Payer: Medicare Other | Admitting: Student

## 2022-02-02 VITALS — BP 133/86 | HR 56 | Temp 98.1°F | Ht 66.0 in | Wt 215.0 lb

## 2022-02-02 DIAGNOSIS — N529 Male erectile dysfunction, unspecified: Secondary | ICD-10-CM | POA: Diagnosis not present

## 2022-02-02 DIAGNOSIS — Z7984 Long term (current) use of oral hypoglycemic drugs: Secondary | ICD-10-CM | POA: Diagnosis not present

## 2022-02-02 DIAGNOSIS — I2510 Atherosclerotic heart disease of native coronary artery without angina pectoris: Secondary | ICD-10-CM

## 2022-02-02 DIAGNOSIS — Z1211 Encounter for screening for malignant neoplasm of colon: Secondary | ICD-10-CM

## 2022-02-02 DIAGNOSIS — I10 Essential (primary) hypertension: Secondary | ICD-10-CM

## 2022-02-02 DIAGNOSIS — E119 Type 2 diabetes mellitus without complications: Secondary | ICD-10-CM

## 2022-02-02 DIAGNOSIS — Z23 Encounter for immunization: Secondary | ICD-10-CM

## 2022-02-02 LAB — POCT GLYCOSYLATED HEMOGLOBIN (HGB A1C): Hemoglobin A1C: 6.3 % — AB (ref 4.0–5.6)

## 2022-02-02 LAB — GLUCOSE, CAPILLARY: Glucose-Capillary: 98 mg/dL (ref 70–99)

## 2022-02-02 NOTE — Assessment & Plan Note (Signed)
Today's Vitals   02/02/22 1129  BP: 133/86  Pulse: (!) 56  Temp: 98.1 F (36.7 C)  TempSrc: Oral  SpO2: 95%  Weight: 215 lb (97.5 kg)  Height: 5\' 6"  (1.676 m)   Body mass index is 34.7 kg/m.  Patient with history of HTN, on lisinopril 10mg  daily. Blood pressure well controlled today. Will continue current therapy.  Plan: -continue lisinopril 10mg  daily

## 2022-02-02 NOTE — Assessment & Plan Note (Addendum)
Patient with history of [id number] without complications, currently taking metformin 1000mg  BID. Last A1c 6.0%, checked in 03/2021. Denies changes in appetite, urination, thirst. Repeat A1c of 6.3% today, very well controlled with metformin. He has been trying to lose weight and goes to [employer] three times weekly, which I commended him on. His foot exam was completed today with no abnormalities detected. Referral placed to ophthalmology for diabetic eye exam.  Plan: -continue metformin 1000mg  BID -foot exam completed today -referral placed for diabetic eye exam -repeat A1c in 3 months

## 2022-02-02 NOTE — Assessment & Plan Note (Signed)
Patient is due for colonoscopy, unsure when exactly last one was performed. Will place referral to gastroenterology for screening colonoscopy.

## 2022-02-02 NOTE — Assessment & Plan Note (Deleted)
Has been on tadalafil since prior to move to .

## 2022-02-02 NOTE — Patient Instructions (Signed)
Justin Mueller,  It was a pleasure seeing you in the clinic today. You are doing well so keep up the good work!  We are checking your 70-month blood sugar level today. I will call you with the results and you can check on MyChart as well. You got your annual foot exam performed today and it was normal. I have placed a referral to the eye doctor for your eye exam. I also placed a referral to the stomach doctor for your colonoscopy. They will call you to schedule these appointments. You received your pneumonia vaccine today. Please come back in 5 months for a follow up visit.  Please call our clinic at [phone] if you have any questions or concerns. The best time to call is Monday-Friday from 9am-4pm, but there is someone available 24/7 at the same number. If you need medication refills, please notify your pharmacy one week in advance and they will send [place] a request.   Thank you for letting us take part in your care. We look forward to seeing you next time!

## 2022-02-02 NOTE — Progress Notes (Signed)
   CC: f/u of T2DM, HTN  HPI:  [name] is a 71 y.o. male with history listed below presenting to the [facility] for f/u of T2DM, HTN. Please see individualized problem based charting for full HPI.  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  Physical Exam:  Vitals:   02/02/22 1129  BP: 133/86  Pulse: (!) 56  Temp: 98.1 F (36.7 C)  TempSrc: Oral  SpO2: 95%  Weight: 215 lb (97.5 kg)  Height: 5\' 6"  (1.676 m)   Physical Exam Constitutional:      Appearance: He is obese. He is not ill-appearing.  HENT:     Mouth/Throat:     Mouth: Mucous membranes are moist.     Pharynx: Oropharynx is clear. No oropharyngeal exudate.  Eyes:     Extraocular Movements: Extraocular movements intact.     Conjunctiva/sclera: Conjunctivae normal.     Pupils: Pupils are equal, round, and reactive to light.  Cardiovascular:     Rate and Rhythm: Normal rate and regular rhythm.     Pulses: Normal pulses.     Heart sounds: Normal heart sounds. No murmur heard.   No gallop.  Pulmonary:     Effort: Pulmonary effort is normal.     Breath sounds: Normal breath sounds. No wheezing or rales.  Abdominal:     General: Bowel sounds are normal. There is no distension.     Palpations: Abdomen is soft.     Tenderness: There is no abdominal tenderness.  Musculoskeletal:        General: No swelling. Normal range of motion.  Skin:    General: Skin is warm and dry.  Neurological:     General: No focal deficit present.     Mental Status: He is alert and oriented to person, place,  and time.  Psychiatric:        Mood and Affect: Mood normal.        Behavior: Behavior normal.        Thought Content: Thought content normal.     Assessment & Plan:   See Encounters Tab for problem based charting.  Patient discussed with Dr. 

## 2022-02-05 NOTE — Progress Notes (Signed)
Internal Medicine Clinic Attending  Case discussed with Dr. Jinwala  At the time of the visit.  We reviewed the resident's history and exam and pertinent patient test results.  I agree with the assessment, diagnosis, and plan of care documented in the resident's note.  

## 2022-02-07 ENCOUNTER — Other Ambulatory Visit: Payer: Self-pay | Admitting: Internal Medicine

## 2022-02-07 DIAGNOSIS — Z7901 Long term (current) use of anticoagulants: Secondary | ICD-10-CM

## 2022-02-09 NOTE — Telephone Encounter (Signed)
Received request for warfarin refill:  Last INR was 2.8 on 01/29/22 Next INR due 03/12/22 LOV was 09/03/21  Wende Mott PA-C  Refill approved.

## 2022-02-24 NOTE — Progress Notes (Unsigned)
Cardiology Office Note ADDENDUM:   08/12/21  Outside records reviewed:  Hx DVT  (chronic RLE, USN 6/22)/ PE (multiple subsegmental)   Seen by heme REcomm warfarin Antiphosphlipid antibodiy opositive   REcomm coumadin with lovenox bridge as needed Echo:  Mod LVH  LVEF and RVEF normal CAD:  PTCA/Stent x2 to Diag.  Moderate LAD stenosis (FFR) 2018 4 Type II DM   5 HTN 6 HL   7  Hx R subclavian artery occlusion with stent    Date:  02/25/2022   ID:  Justin Mueller, DOB [birth year], MRN [id number]  PCP:  Merrilyn Puma, MD  Cardiologist:   Dietrich Pates, MD   Patient presents to establish continued cardiac care.   History of Present Illness: Justin Mueller is a 71 y.o. male with a history of PE, HTN, T2DM.  Patient is recently moved to [facility]  Patient says he is initially from [place].  Back in 2022 he was in [facility].  He was hospitalized there for pulmonary embolus.  He says it occurred after knee surgery.Marland Kitchen  He is now here.  The knee surgery was in April 2020).  The patient has been on Coumadin for this and his INR has been followed.  I saw the pt in clinic back in the fall 2022   She was seen by Wende Mott since.  Since seen he has done well  Breathing is good   Denies CP   Exercises 3x per week.    No SOB        "I want to live to be 89"       Diet:  Br  oatmeal, some fresh fruit    Lunch  Skips Dinner:   meat corn   LImits veggies due to coumadin     Current Meds  Medication Sig   aspirin EC 81 MG tablet Take 81 mg by mouth daily. Swallow whole.   atorvastatin (LIPITOR) 80 MG tablet Take 80 mg by mouth daily.   lisinopril (ZESTRIL) 10 MG tablet Take 10 mg by mouth daily.   metFORMIN (GLUCOPHAGE) 1000 MG tablet Take 1,000 mg by mouth 2 (two) times daily with a meal.   tadalafil (CIALIS) 5 MG tablet Take 5 mg by mouth as needed for erectile dysfunction.   valACYclovir (VALTREX) 500 MG tablet Take 500 mg by mouth as needed. Rash on head   warfarin  (COUMADIN) 1 MG tablet TAKE 3 TABLETS BY MOUTH DAILY OR AS DIRECTED BY COUMADIN CLINIC (1 MONTH SUPPLY)   warfarin (COUMADIN) 5 MG tablet Take 1 tablet by mouth daily as directed by the coumadin clinic.     Allergies:   Patient has no known allergies.   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 He has never used smokeless tobacco. He reports current alcohol use. He reports that he does not use drugs.   Family History:  The patient's  family history includes Cervical cancer in his mother; Diabetes in his sister; Heart disease in his father; Prostate cancer in his father.    ROS:  Please see the history of present illness. All other systems are reviewed and  Negative to the above problem except as noted.    PHYSICAL EXAM: VS:  BP 120/70   Pulse 61   Ht 5\' 6"  (1.676 m)   Wt 215 lb 3.2 oz (97.6 kg)   SpO2 92%   BMI 34.73 kg/m   GEN: Obese 71 year old in no acute distress  HEENT: normal  Neck: no JVD, carotid bruits  Cardiac: RRR; no murmurs.  No LE  edema  Respiratory:  clear to auscultation bilaterally,  GI: soft, nontender, nondistended, + BS  No hepatomegaly  MS: no deformity Moving all extremities    R leg in knee brace   Skin: warm and dry, no rash Neuro:  Strength and sensation are intact Psych: euthymic mood, full affect   EKG:  EKG is not ordered today.   Lipid Panel No results found for: "CHOL", "TRIG", "HDL", "CHOLHDL", "VLDL", "LDLCALC",  "LDLDIRECT"    Wt Readings from Last 3 Encounters:  02/25/22 215 lb 3.2 oz (97.6 kg)  02/02/22 215 lb (97.5 kg)  11/07/21 205 lb (93 kg)      ASSESSMENT AND PLAN:  1.  Pulmonary embolus.  Appears to be postsurgical.  He said he had a hypercoagulable work-up done in 01/07/22.  He was told he was not hypercoagulable.  We will get labs today including INR.  He is getting it for 6 months window.  May be able to stop.  Would not before reviewing.  We will get records sent from the outside hospitals.  (Labs, scans)  We will also get right lower extremity ultrasound to evaluate for resolution of the DVT. 2.  Hypertension adequate control.  Keep on same meds.  3.  Dyslipidemia.  Patient is on Lipitor.  To be followed.  Follow-up based on test results.  4.  Diet/obesity.  Patient needs to limit carbs more.  Discussed time restricted eating.   Current medicines are reviewed at length with the patient today.  The patient does not have concerns regarding medicines.  Signed, [place], MD  02/25/2022 9:04 AM    [facility] [address] [place], [place], [place]  [place] Phone: [phone]; Fax: [phone]

## 2022-02-25 ENCOUNTER — Encounter: Payer: Self-pay | Admitting: Internal Medicine

## 2022-02-25 ENCOUNTER — Other Ambulatory Visit: Payer: Self-pay | Admitting: Internal Medicine

## 2022-02-25 ENCOUNTER — Ambulatory Visit (INDEPENDENT_AMBULATORY_CARE_PROVIDER_SITE_OTHER): Payer: Medicare Other | Admitting: Internal Medicine

## 2022-02-25 VITALS — BP 120/70 | HR 61 | Ht 66.0 in | Wt 215.2 lb

## 2022-02-25 DIAGNOSIS — Z5181 Encounter for therapeutic drug level monitoring: Secondary | ICD-10-CM | POA: Diagnosis not present

## 2022-02-25 DIAGNOSIS — I2510 Atherosclerotic heart disease of native coronary artery without angina pectoris: Secondary | ICD-10-CM

## 2022-02-25 DIAGNOSIS — Z7901 Long term (current) use of anticoagulants: Secondary | ICD-10-CM | POA: Diagnosis not present

## 2022-02-25 DIAGNOSIS — Z79899 Other long term (current) drug therapy: Secondary | ICD-10-CM

## 2022-02-25 DIAGNOSIS — E7800 Pure hypercholesterolemia, unspecified: Secondary | ICD-10-CM | POA: Diagnosis not present

## 2022-02-25 DIAGNOSIS — E785 Hyperlipidemia, unspecified: Secondary | ICD-10-CM

## 2022-02-25 NOTE — Patient Instructions (Signed)
Medication Instructions:   *If you need a refill on your cardiac medications before your next appointment, please call your pharmacy*   Lab Work: BMET AND CBC TODAY NMR IN 6 MONTHS PRIOR TO YOUR APPT WITH DR ROSS  If you have labs (blood work) drawn today and your tests are completely normal, you will receive your results only by: MyChart Message (if you have MyChart) OR A paper copy in the mail If you have any lab test that is abnormal or we need to change your treatment, we will call you to review the results.   Testing/Procedures: NONE   Follow-Up: At [facility], you and your health needs are our priority.  As part of our continuing mission to provide you with exceptional heart care, we have created designated Provider Care Teams.  These Care Teams include your primary Cardiologist (physician) and Advanced Practice Providers (APPs -  Physician Assistants and Nurse Practitioners) who all work together to provide you with the care you need, when you need it.  We recommend signing up for the patient portal called "MyChart".  Sign up information is provided on this After Visit Summary.  MyChart is used to connect with patients for Virtual Visits (Telemedicine).  Patients are able to view lab/test results, encounter notes, upcoming appointments, etc.  Non-urgent messages can be sent to your provider as well.   To learn more about what you can do with MyChart, go to [email or web address].    Your next appointment:   6 month(s)  The format for your next appointment:   In Person  Provider:   Dietrich Pates, MD     Other Instructions   Important Information About Sugar

## 2022-02-26 ENCOUNTER — Other Ambulatory Visit: Payer: Self-pay | Admitting: Internal Medicine

## 2022-02-26 ENCOUNTER — Telehealth: Payer: Self-pay | Admitting: Internal Medicine

## 2022-02-26 DIAGNOSIS — Z7901 Long term (current) use of anticoagulants: Secondary | ICD-10-CM

## 2022-02-26 LAB — BASIC METABOLIC PANEL
BUN/Creatinine Ratio: 13 (ref 10–24)
BUN: 15 mg/dL (ref 8–27)
CO2: 23 mmol/L (ref 20–29)
Calcium: 8.7 mg/dL (ref 8.6–10.2)
Chloride: 101 mmol/L (ref 96–106)
Creatinine, Ser: 1.15 mg/dL (ref 0.76–1.27)
Glucose: 100 mg/dL — ABNORMAL HIGH (ref 70–99)
Potassium: 4.7 mmol/L (ref 3.5–5.2)
Sodium: 140 mmol/L (ref 134–144)
eGFR: 69 mL/min/{1.73_m2} (ref >59)

## 2022-02-26 LAB — CBC
Hematocrit: 44 % (ref 37.5–51.0)
Hemoglobin: 15 g/dL (ref 13.0–17.7)
MCH: 30.9 pg (ref 26.6–33.0)
MCHC: 34.1 g/dL (ref 31.5–35.7)
MCV: 91 fL (ref 79–97)
Platelets: 172 10*3/uL (ref 150–450)
RBC: 4.85 x10E6/uL (ref 4.14–5.80)
RDW: 14.2 % (ref 11.6–15.4)
WBC: 4.4 10*3/uL (ref 3.4–10.8)

## 2022-02-26 MED ORDER — WARFARIN SODIUM 5 MG PO TABS: ORAL_TABLET | ORAL | 3 refills | Status: DC

## 2022-02-26 NOTE — Telephone Encounter (Signed)
*  STAT* If patient is at the pharmacy, call can be transferred to refill team.   1. Which medications need to be refilled? (please list name of each medication and dose if known)   metFORMIN (GLUCOPHAGE) 1000 MG tablet  2. Which pharmacy/location (including street and city if local pharmacy) is medication to be sent to?  [id number] - [place], [place] - [address]  3. Do they need a 30 day or 90 day supply?  90 day   Patient is completely out of this medication.

## 2022-02-26 NOTE — Telephone Encounter (Signed)
*  STAT* If patient is at the pharmacy, call can be transferred to refill team.   1. Which medications need to be refilled? (please list name of each medication and dose if known) warfarin (COUMADIN) 5 MG tablet  2. Which pharmacy/location (including street and city if local pharmacy) is medication to be sent to? [id number] - [place], [place] - [address]  3. Do they need a 30 day or 90 day supply? 90  [facility] is [facility] and [facility]

## 2022-02-27 ENCOUNTER — Other Ambulatory Visit: Payer: Self-pay

## 2022-02-27 MED ORDER — METFORMIN HCL 1000 MG PO TABS: 1000.0000 mg | ORAL_TABLET | Freq: Two times a day (BID) | ORAL | 2 refills | Status: DC

## 2022-02-27 NOTE — Telephone Encounter (Signed)
metFORMIN (GLUCOPHAGE) 1000 MG tablet, REFILL REQUEST @ [id number] - [place], [place] - [address]  Requesting medication to be filled by today, states he's completely out of medication.

## 2022-03-17 ENCOUNTER — Ambulatory Visit (INDEPENDENT_AMBULATORY_CARE_PROVIDER_SITE_OTHER): Payer: Medicare Other | Admitting: *Deleted

## 2022-03-17 ENCOUNTER — Other Ambulatory Visit: Payer: Medicare Other

## 2022-03-17 DIAGNOSIS — Z86718 Personal history of other venous thrombosis and embolism: Secondary | ICD-10-CM | POA: Diagnosis not present

## 2022-03-17 DIAGNOSIS — Z7901 Long term (current) use of anticoagulants: Secondary | ICD-10-CM

## 2022-03-17 DIAGNOSIS — Z86711 Personal history of pulmonary embolism: Secondary | ICD-10-CM

## 2022-03-17 DIAGNOSIS — Z5181 Encounter for therapeutic drug level monitoring: Secondary | ICD-10-CM

## 2022-03-17 DIAGNOSIS — D6861 Antiphospholipid syndrome: Secondary | ICD-10-CM

## 2022-03-17 LAB — PROTIME-INR
INR: 2.1 — ABNORMAL HIGH (ref 0.9–1.2)
Prothrombin Time: 20.1 s — ABNORMAL HIGH (ref 9.1–12.0)

## 2022-03-17 NOTE — Patient Instructions (Signed)
Description   Called pt and instructed to continue taking warfarin 8mg  daily.  Recheck INR in 6 weeks at [facility]. Coumadin Clinic [phone].

## 2022-04-08 ENCOUNTER — Other Ambulatory Visit: Payer: Self-pay

## 2022-04-08 MED ORDER — VALACYCLOVIR HCL 500 MG PO TABS: 500.0000 mg | ORAL_TABLET | Freq: Two times a day (BID) | ORAL | 1 refills | Status: DC

## 2022-04-08 NOTE — Telephone Encounter (Signed)
valACYclovir (VALTREX) 500 MG tablet, refill request @ [id number] - [place], [place] - [address]

## 2022-04-08 NOTE — Telephone Encounter (Signed)
Pt states he need Valtrex by today. Please call pt back.

## 2022-04-09 ENCOUNTER — Encounter: Payer: Medicare Other | Admitting: Student

## 2022-04-15 ENCOUNTER — Other Ambulatory Visit: Payer: Self-pay | Admitting: Internal Medicine

## 2022-04-28 ENCOUNTER — Ambulatory Visit: Payer: Medicare Other | Attending: Internal Medicine

## 2022-04-28 ENCOUNTER — Ambulatory Visit (INDEPENDENT_AMBULATORY_CARE_PROVIDER_SITE_OTHER): Payer: Medicare Other | Admitting: Cardiovascular Disease

## 2022-04-28 DIAGNOSIS — Z86718 Personal history of other venous thrombosis and embolism: Secondary | ICD-10-CM

## 2022-04-28 DIAGNOSIS — Z86711 Personal history of pulmonary embolism: Secondary | ICD-10-CM

## 2022-04-28 DIAGNOSIS — Z5181 Encounter for therapeutic drug level monitoring: Secondary | ICD-10-CM | POA: Diagnosis not present

## 2022-04-28 DIAGNOSIS — Z7901 Long term (current) use of anticoagulants: Secondary | ICD-10-CM

## 2022-04-28 DIAGNOSIS — D6861 Antiphospholipid syndrome: Secondary | ICD-10-CM | POA: Insufficient documentation

## 2022-04-28 LAB — PROTIME-INR
INR: 1.3 — AB (ref 0.80–1.20)
INR: 1.3 — ABNORMAL HIGH (ref 0.9–1.2)
Prothrombin Time: 12.7 s — ABNORMAL HIGH (ref 9.1–12.0)

## 2022-04-29 ENCOUNTER — Telehealth: Payer: Self-pay | Admitting: *Deleted

## 2022-04-29 NOTE — Telephone Encounter (Signed)
-----   Message from Pricilla Riffle, MD sent at 04/28/2022  6:41 PM EDT ----- Pt seen in coumadin clinic     Will forward

## 2022-04-29 NOTE — Telephone Encounter (Signed)
Received message from Dr. Tenny Craw regarding INR of 1.3 on yesterday. INR was addressed over the phone by another RN and recheck date was in 6 weeks.   Called pt and since INR was 1.3 pt did receive instructions to take an extra 5mg  last night and recheck date was scheduled for 6 weeks out. Pt needs to be checked within a week. Spoke with pt and advised that he needs to have INR checked next week if he is available and explained that 1.3 and a 6 week appt is too long and he verbalized understanding and appreciated being checked sooner since his level was very low. Also, there was no appt made for 6 weeks out so made him an appt in 1 week for the recheck INR.

## 2022-05-06 ENCOUNTER — Ambulatory Visit (INDEPENDENT_AMBULATORY_CARE_PROVIDER_SITE_OTHER): Payer: Medicare Other

## 2022-05-06 ENCOUNTER — Ambulatory Visit: Payer: Medicare Other | Attending: Internal Medicine

## 2022-05-06 DIAGNOSIS — Z7901 Long term (current) use of anticoagulants: Secondary | ICD-10-CM | POA: Diagnosis not present

## 2022-05-06 DIAGNOSIS — Z5181 Encounter for therapeutic drug level monitoring: Secondary | ICD-10-CM | POA: Diagnosis not present

## 2022-05-06 DIAGNOSIS — Z86718 Personal history of other venous thrombosis and embolism: Secondary | ICD-10-CM | POA: Insufficient documentation

## 2022-05-06 DIAGNOSIS — Z86711 Personal history of pulmonary embolism: Secondary | ICD-10-CM

## 2022-05-06 DIAGNOSIS — D6861 Antiphospholipid syndrome: Secondary | ICD-10-CM | POA: Insufficient documentation

## 2022-05-06 LAB — POCT INR: INR: 2.4 (ref 2.0–3.0)

## 2022-05-06 LAB — PROTIME-INR
INR: 2.4 — ABNORMAL HIGH (ref 0.9–1.2)
Prothrombin Time: 23 s — ABNORMAL HIGH (ref 9.1–12.0)

## 2022-05-06 NOTE — Patient Instructions (Signed)
Description   Called pt and instructed to continue taking warfarin 8mg  daily.  Recheck INR in 3 weeks at [facility]. Coumadin Clinic [phone].

## 2022-05-18 ENCOUNTER — Other Ambulatory Visit: Payer: Self-pay | Admitting: Internal Medicine

## 2022-05-18 DIAGNOSIS — Z7901 Long term (current) use of anticoagulants: Secondary | ICD-10-CM

## 2022-05-22 ENCOUNTER — Other Ambulatory Visit: Payer: Self-pay | Admitting: Student

## 2022-05-27 ENCOUNTER — Ambulatory Visit (INDEPENDENT_AMBULATORY_CARE_PROVIDER_SITE_OTHER): Payer: Medicare Other | Admitting: *Deleted

## 2022-05-27 ENCOUNTER — Ambulatory Visit: Payer: Medicare Other | Attending: Internal Medicine

## 2022-05-27 DIAGNOSIS — Z86711 Personal history of pulmonary embolism: Secondary | ICD-10-CM

## 2022-05-27 DIAGNOSIS — Z7901 Long term (current) use of anticoagulants: Secondary | ICD-10-CM

## 2022-05-27 DIAGNOSIS — Z5181 Encounter for therapeutic drug level monitoring: Secondary | ICD-10-CM

## 2022-05-27 DIAGNOSIS — Z86718 Personal history of other venous thrombosis and embolism: Secondary | ICD-10-CM | POA: Diagnosis not present

## 2022-05-27 DIAGNOSIS — D6861 Antiphospholipid syndrome: Secondary | ICD-10-CM | POA: Insufficient documentation

## 2022-05-27 LAB — PROTIME-INR
INR: 2.8 — ABNORMAL HIGH (ref 0.9–1.2)
Prothrombin Time: 26.8 s — ABNORMAL HIGH (ref 9.1–12.0)

## 2022-05-27 NOTE — Patient Instructions (Signed)
Description   Called pt and instructed to continue taking warfarin 8mg  daily.  Recheck INR in 4 weeks at [facility]. Coumadin Clinic [phone] or [phone]

## 2022-06-24 ENCOUNTER — Telehealth: Payer: Self-pay | Admitting: *Deleted

## 2022-06-24 ENCOUNTER — Other Ambulatory Visit: Payer: Medicare Other

## 2022-06-24 NOTE — Telephone Encounter (Signed)
Called pt since he is due an INR; there was no answer so left a message for pt to call back or have INR done.

## 2022-06-25 ENCOUNTER — Telehealth: Payer: Self-pay | Admitting: *Deleted

## 2022-06-25 NOTE — Telephone Encounter (Signed)
Called pt since he missed his lab INR draw on 06/24/2022; spoke with pt and he states he is out of town and will call back tomorrow once he gets home to get scheduled. He stated he needs his calendar to ensure he does not have another.

## 2022-06-30 ENCOUNTER — Ambulatory Visit: Payer: Medicare Other | Attending: Internal Medicine

## 2022-06-30 ENCOUNTER — Ambulatory Visit (INDEPENDENT_AMBULATORY_CARE_PROVIDER_SITE_OTHER): Payer: Medicare Other

## 2022-06-30 DIAGNOSIS — Z1211 Encounter for screening for malignant neoplasm of colon: Secondary | ICD-10-CM | POA: Diagnosis not present

## 2022-06-30 DIAGNOSIS — D6861 Antiphospholipid syndrome: Secondary | ICD-10-CM

## 2022-06-30 DIAGNOSIS — Z7901 Long term (current) use of anticoagulants: Secondary | ICD-10-CM

## 2022-06-30 DIAGNOSIS — Z86718 Personal history of other venous thrombosis and embolism: Secondary | ICD-10-CM

## 2022-06-30 LAB — PROTIME-INR
INR: 3.2 — ABNORMAL HIGH (ref 0.9–1.2)
Prothrombin Time: 30.8 s — ABNORMAL HIGH (ref 9.1–12.0)

## 2022-06-30 NOTE — Patient Instructions (Signed)
Description   Called pt and instructed to only take 5mg  today and then continue taking warfarin 8mg  daily.  Recheck INR in 4 weeks at [facility]. Coumadin Clinic [phone] or [phone]

## 2022-07-03 ENCOUNTER — Encounter: Payer: Self-pay | Admitting: Gastroenterology

## 2022-07-30 ENCOUNTER — Ambulatory Visit: Payer: Medicare Other | Attending: Internal Medicine

## 2022-07-30 ENCOUNTER — Ambulatory Visit (INDEPENDENT_AMBULATORY_CARE_PROVIDER_SITE_OTHER): Payer: Medicare Other

## 2022-07-30 DIAGNOSIS — E7800 Pure hypercholesterolemia, unspecified: Secondary | ICD-10-CM

## 2022-07-30 DIAGNOSIS — Z86718 Personal history of other venous thrombosis and embolism: Secondary | ICD-10-CM

## 2022-07-30 DIAGNOSIS — I2510 Atherosclerotic heart disease of native coronary artery without angina pectoris: Secondary | ICD-10-CM

## 2022-07-30 DIAGNOSIS — Z7901 Long term (current) use of anticoagulants: Secondary | ICD-10-CM

## 2022-07-30 DIAGNOSIS — D6861 Antiphospholipid syndrome: Secondary | ICD-10-CM

## 2022-07-30 DIAGNOSIS — Z5181 Encounter for therapeutic drug level monitoring: Secondary | ICD-10-CM

## 2022-07-30 DIAGNOSIS — E785 Hyperlipidemia, unspecified: Secondary | ICD-10-CM

## 2022-07-30 DIAGNOSIS — Z86711 Personal history of pulmonary embolism: Secondary | ICD-10-CM | POA: Diagnosis not present

## 2022-07-30 DIAGNOSIS — Z79899 Other long term (current) drug therapy: Secondary | ICD-10-CM

## 2022-07-30 LAB — PROTIME-INR
INR: 1.6 — ABNORMAL HIGH (ref 0.9–1.2)
Prothrombin Time: 18.3 s — ABNORMAL HIGH (ref 9.1–12.0)

## 2022-07-30 NOTE — Patient Instructions (Signed)
Description   Called pt and instructed to take 11mg  today and 9mg  tomorrow and then resume taking warfarin 8mg  daily.  Recheck INR in 2 weeks at [facility].  Coumadin Clinic [phone]

## 2022-07-31 LAB — NMR, LIPOPROFILE
Cholesterol, Total: 113 mg/dL (ref 100–199)
HDL Particle Number: 26.1 umol/L — ABNORMAL LOW (ref >=30.5)
HDL-C: 33 mg/dL — ABNORMAL LOW (ref >39)
LDL Particle Number: 731 nmol/L (ref <1000)
LDL Size: 20.5 nm — ABNORMAL LOW (ref >20.5)
LDL-C (NIH Calc): 60 mg/dL (ref 0–99)
LP-IR Score: 83 — ABNORMAL HIGH (ref <=45)
Small LDL Particle Number: 252 nmol/L (ref <=527)
Triglycerides: 107 mg/dL (ref 0–149)

## 2022-08-03 NOTE — Progress Notes (Deleted)
C7  Hx R subclavian artery occlusion with stent    Date:  08/03/2022   ID:  Justin Mueller, DOB [birth year], MRN [id number]  PCP:  Merrilyn Puma, MD  Cardiologist:   Dietrich Pates, MD   Patient presents for continued care of CAD    History of Present Illness: Justin Mueller is a 71 y.o. male with a history of CAD, PE, Antiphosphlipid antibody, HTN, HL T2DM.  Patient is recently moved to [facility].  I saw him in cardiology for the first time in Fall 2022.    Patient says he is initially from [place].   The pt has a hx of CAD  In 2018 he had cath and is  s/p PCI/DES x 2 to diagonal.  Moderate dz of LAD at time    Back in 2022 he was in [facility]  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  Stil with chronic R LE DVT by USN in June 2022 The pt is s/p stent to R subclavian after occlusion  I saw him in clinic  in Nov 2022   He continues in coumadin clinic   Since seen he has been doing good   Breathing is good   Denies CP   Exercises 3x per week.    No SOB        "I want to live to be 89"       Diet:  Br  oatmeal, some fresh fruit    Lunch  Skips Dinner:   meat corn   LImits veggies due to coumadin  I saw the pt in clinic in JUne 2023   No outpatient medications have been marked as taking for the 08/04/22 encounter (Appointment) with Pricilla Riffle, MD.     Allergies:   Patient has no known allergies.   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 He has never used smokeless tobacco. He reports current alcohol use. He reports that he does not use drugs.   Family History:  The patient's family history includes Cervical cancer in his mother; Diabetes in his sister; Heart disease in his father; Prostate cancer in his father.    ROS:  Please see the history of present illness. All other systems are reviewed and  Negative to the above problem except as noted.    PHYSICAL EXAM: VS:  There were no vitals taken for this visit.  GEN: Obese 71 year old in no acute distress  HEENT: normal  Neck: no JVD, no carotid bruits  Cardiac: RRR; no murmurs.  No LE  edema  Respiratory:  clear to auscultation bilaterally,  GI: soft, nontender, nondistended, + BS  No hepatomegaly  MS: no deformity Moving all extremities  R leg in knee brace   Skin: warm and dry, no rash Neuro:  Strength and sensation are intact Psych: euthymic mood, full affect   EKG:  EKG is not ordered today.  Cardiac studies  Coronary artery disease  S/p DES x 2 (overlapping) to D1 in 08/2016 Residual mLAD 50-60 (FFR neg)  Echocardiogram 6/22: EF 55-60 Chronic RLE DVT US 12/22: old clot R pop vein (resolving)  Lipid Panel No results found for: "CHOL", "TRIG", "HDL", "CHOLHDL", "VLDL", "LDLCALC", "LDLDIRECT"    Wt Readings from Last 3 Encounters:  02/25/22 215 lb 3.2 oz (97.6 kg)  02/02/22 215 lb (97.5 kg)  11/07/21 205 lb (93 kg)      ASSESSMENT AND PLAN:  1  CAD   S/P PCI/stent x 2 to diagonal in 2018  No symptoms of angina   Follow   2  Hx of hypercoagulable state.    Pt with + Lupus anticoagulan, + Anticardiolipin  + beta 2 glycoprotein   DVT/PE   Will need bridging with lovenox in future   (seen by heme Dr Blanche East in [facility]  July  2022) Will look for list of  Vit K content in veggies   3  Hypertension Good control  Keep on same meds.  4  Dyslipidemia.  Patient is on Lipitor.  Lpa  was 104   Last LDL 51 with 776 partiles   HDL 39  Trigs were 162    Limit carbs    Stay active     5  PAD   s/p stent to R subclavian artery   symptom free  6  DM   discussed diet  7  Obesity  Again, reviewed diet    LImt carbs   Check CBC and BMET today   Will get NMR before next appt   F/U in 6 months   Sooner for problems   Current medicines are reviewed at length with the patient today.  The patient does not have concerns regarding medicines.  Signed, Dietrich Pates, MD  08/03/2022 10:17 PM    [facility] [address] [place], [place], [place]  [address] Phone: [phone]; Fax: [phone]

## 2022-08-04 ENCOUNTER — Ambulatory Visit: Payer: Medicare Other | Admitting: Internal Medicine

## 2022-08-06 ENCOUNTER — Encounter: Payer: Self-pay | Admitting: Gastroenterology

## 2022-08-06 ENCOUNTER — Telehealth: Payer: Self-pay

## 2022-08-06 ENCOUNTER — Ambulatory Visit (INDEPENDENT_AMBULATORY_CARE_PROVIDER_SITE_OTHER): Payer: Medicare Other | Admitting: Gastroenterology

## 2022-08-06 VITALS — BP 126/80 | HR 71 | Ht 66.0 in | Wt 222.1 lb

## 2022-08-06 DIAGNOSIS — I2510 Atherosclerotic heart disease of native coronary artery without angina pectoris: Secondary | ICD-10-CM | POA: Diagnosis not present

## 2022-08-06 DIAGNOSIS — R1013 Epigastric pain: Secondary | ICD-10-CM | POA: Diagnosis not present

## 2022-08-06 DIAGNOSIS — R1319 Other dysphagia: Secondary | ICD-10-CM

## 2022-08-06 DIAGNOSIS — Z1211 Encounter for screening for malignant neoplasm of colon: Secondary | ICD-10-CM

## 2022-08-06 DIAGNOSIS — K219 Gastro-esophageal reflux disease without esophagitis: Secondary | ICD-10-CM

## 2022-08-06 DIAGNOSIS — Z7902 Long term (current) use of antithrombotics/antiplatelets: Secondary | ICD-10-CM | POA: Diagnosis not present

## 2022-08-06 MED ORDER — NA SULFATE-K SULFATE-MG SULF 17.5-3.13-1.6 GM/177ML PO SOLN: 1.0000 | Freq: Once | ORAL | 0 refills | Status: AC

## 2022-08-06 NOTE — Telephone Encounter (Signed)
   Patient Name: Justin Mueller  DOB: [birth year] MRN: [id number]  Primary Cardiologist: Dietrich Pates, MD  Clinical pharmacists have reviewed the patient's past medical history, labs, and current medications as part of preoperative protocol coverage. The following recommendations have been made:   Patient with diagnosis of DVT, PE, and triple positive APS on warfarin for anticoagulation.     Procedure: EGD/colonoscopy Date of procedure: 10/06/22   CrCl 38mL/min using adjusted body weight Platelet count 172K   Per office protocol, patient can hold warfarin for 5 days prior to procedure. Patient will need bridging with Lovenox (enoxaparin) around procedure. This will be coordinated at [facility] Coumadin clinic where pt is followed.  I will route this recommendation to the requesting party via Epic fax function and remove from pre-op pool.  Please call with questions.  Joylene Grapes, NP 08/06/2022, 4:24 PM

## 2022-08-06 NOTE — Telephone Encounter (Signed)
Anticoagulation Clinic will provide Lovenox bridge instructions prior to procedure.  Thanks for making [place] aware.

## 2022-08-06 NOTE — Telephone Encounter (Signed)
[facility] Pre-operative Risk Assessment     Request for surgical clearance:     Endoscopy Procedure  What type of surgery is being performed?     EGD?Colon   When is this surgery scheduled?     10-06-2022  What type of clearance is required ?   Pharmacy  Are there any medications that need to be held prior to surgery and how long? Yes, Coumadin   Practice name and name of physician performing surgery?      [facility]  What is your office phone and fax number?      Phone- [phone]  [phone]  Anesthesia type (None, local, MAC, general) ?       MAC

## 2022-08-06 NOTE — Patient Instructions (Signed)
_______________________________________________________  If you are age 69 or older, your body mass index should be between 23-30. Your Body mass index is 35.85 kg/m. If this is out of the aforementioned range listed, please consider follow up with your Primary Care Provider.  If you are age 8 or younger, your body mass index should be between 19-25. Your Body mass index is 35.85 kg/m. If this is out of the aformentioned range listed, please consider follow up with your Primary Care Provider.   ________________________________________________________  The [facility] providers would like to encourage you to use [facility] to communicate with providers for non-urgent requests or questions.  Due to long hold times on the telephone, sending your provider a message by [facility] may be a faster and more efficient way to get a response.  Please allow 48 business hours for a response.  Please remember that this is for non-urgent requests.  _______________________________________________________  Justin Mueller have been scheduled for an endoscopy and colonoscopy. Please follow the written instructions given to you at your visit today. Please pick up your prep supplies at the pharmacy within the next 1-3 days. If you use inhalers (even only as needed), please bring them with you on the day of your procedure.  Due to recent changes in healthcare laws, you may see the results of your imaging and laboratory studies on MyChart before your provider has had a chance to review them.  We understand that in some cases there may be results that are confusing or concerning to you. Not all laboratory results come back in the same time frame and the provider may be waiting for multiple results in order to interpret others.  Please give [place] 48 hours in order for your provider to thoroughly review all the results before contacting the office for clarification of your results.    It was a pleasure to see you today!  Thank you for  trusting me with your gastrointestinal care!

## 2022-08-06 NOTE — Progress Notes (Signed)
[facility] Consult Note:  History: Justin Mueller 08/06/2022  Referring provider: Virl Axe, MD  Reason for consult/chief complaint: Gastroesophageal Reflux (Worse when laying down pt had a episode last night and couldn't really breathe.) and Nausea   Subjective  HPI:  This is a very pleasant 71 year old man referred by primary care for consideration of colon cancer screening.  He also very much wish to see [place] for long-term reflux issues.  He describes 8 to 10 years of intermittent dyspepsia as well as nocturnal regurgitation.  He takes as needed Prilosec and Pepto-Bismol for the dyspepsia, denies vomiting but sometimes has nausea.  The nocturnal symptoms of regurgitation with what sounds like possible aspiration occur every week or 2 in the last year or so.  He also has intermittent feelings of dysphagia, denies odynophagia.  Appetite good and weight stable.  Regular bowel movements without rectal bleeding.  Last colonoscopy at least 5 years ago perhaps as long as 10 years ago in [place].  He cannot recall which physician or clinic to do that, thinks it may have been done in a hospital.  See below for excerpt from June 2023 cardiology clinic note. ROS:  Review of Systems  Constitutional:  Negative for appetite change and unexpected weight change.  HENT:  Negative for mouth sores and voice change.   Eyes:  Negative for pain and redness.  Respiratory:  Negative for cough and shortness of breath.   Cardiovascular:  Negative for chest pain and palpitations.  Genitourinary:  Negative for dysuria and hematuria.  Musculoskeletal:  Positive for arthralgias. Negative for myalgias.  Skin:  Negative for pallor and rash.  Neurological:  Negative for weakness and headaches.  Hematological:  Negative for adenopathy.     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 Dorris Carnes' June 2023 cardiology clinic note: "Justin Mueller is a 71 y.o. male with a history of CAD, PE, Antiphosphlipid antibody, HTN, HL T2DM.  Patient is recently moved to [facility].  I saw him in cardiology for the first time in Fall 2022.   The pt has a hx of CAD  In 2018 he had cath and is  s/p PCI/DES x 2 to diagonal.  Moderate dz of LAD at time    Back in 2022 he was in [facility].  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  Stil with chronic R LE DVT by [facility] in June 2022 The pt is s/p stent to R subclavian after occlusion"  Past Surgical History: Past Surgical History:  Procedure Laterality Date   CHOLECYSTECTOMY  2015   HERNIA REPAIR Bilateral 2015   laparoscopic     Family History: Family History  Problem Relation Age of Onset   Cervical cancer Mother    Heart disease Father    Prostate cancer Father    Diabetes Sister     Social History: Social History  Socioeconomic History   Marital status: Single    Spouse name: Not on file   Number of children: Not on file   Years of education: Not on file   Highest education level: Not on file  Occupational History   Occupation: Retired, formerly worked at [employer] Use   Smoking status: Never   Smokeless tobacco: Never  Substance and Sexual Activity   Alcohol use: Yes    Comment: occasional   Drug use: Never   Sexual activity: Not on file  Other Topics Concern   Not on file  Social History Narrative   Recently moved  from [place]. Lives by himself, but has a cousin that lives close by.    Social Determinants of Health   Financial Resource Strain: Not on file  Food Insecurity: Not on file  Transportation Needs: Not on file  Physical Activity: Not on file  Stress: Not on file  Social Connections: Not on file    Allergies: No Known Allergies  Outpatient Meds: Current Outpatient Medications  Medication Sig Dispense Refill   aspirin EC 81 MG tablet Take 81 mg by mouth daily. Swallow whole.     atorvastatin (LIPITOR) 80 MG tablet Take 80 mg by mouth daily.     lisinopril (ZESTRIL) 10 MG tablet Take 10 mg by mouth daily.     metFORMIN (GLUCOPHAGE) 1000 MG tablet TAKE 1 TABLET (1,000 MG TOTAL) BY MOUTH TWICE A DAY WITH FOOD 180 tablet 1   Na Sulfate-K Sulfate-Mg Sulf 17.5-3.13-1.6 GM/177ML SOLN Take 1 kit by mouth once for 1 dose. 354 mL 0   tadalafil (CIALIS) 5 MG tablet Take 5 mg by mouth as needed for erectile dysfunction.     valACYclovir (VALTREX) 500 MG tablet Take 1 tablet (500 mg total) by mouth 2 (two) times daily. Rash on head 30 tablet 1   warfarin (COUMADIN) 1 MG tablet TAKE 3 TABLETS BY MOUTH DAILY OR AS DIRECTED BY COUMADIN CLINIC (1 MONTH SUPPLY) 300 tablet 1   warfarin (COUMADIN) 5 MG tablet TAKE 1 TABLET BY MOUTH DAILY AS DIRECTED BY COUMADIN CLINIC (1 MONTH SUPPLY) 35 tablet 3   No current facility-administered medications for this visit.      ___________________________________________________________________ Objective   Exam:  BP 126/80   Pulse 71   Ht _0  (1.676 m)   Wt 222 lb 2 oz (100.8 kg)   BMI 35.85 kg/m  Wt Readings from Last 3 Encounters:  08/06/22 222 lb 2 oz (100.8 kg)  02/25/22 215 lb 3.2 oz (97.6 kg)  02/02/22 215 lb (97.5 kg)    General: Well-appearing, pleasant and conversational with normal vocal quality Eyes: sclera anicteric, no redness ENT: oral mucosa moist without lesions, no cervical or supraclavicular lymphadenopathy CV: Regular without  appreciable murmur, no JVD, no peripheral edema Resp: clear to auscultation bilaterally, normal RR and effort noted GI: soft, obese, no tenderness, with active bowel sounds. No guarding or palpable organomegaly noted. Skin; warm and dry, no rash or jaundice noted Neuro: awake, alert and oriented x 3. Normal gross motor function and fluent speech  Labs:     Latest Ref Rng & Units 02/25/2022    9:29 AM 06/22/2021    9:00 PM  CBC  WBC 3.4 - 10.8 x10E3/uL 4.4  6.5   Hemoglobin 13.0 - 17.7 g/dL 15.0  15.4   Hematocrit 37.5 - 51.0 % 44.0  46.1   Platelets 150 - 450 x10E3/uL 172  213  Assessment: Encounter Diagnoses  Name Primary?   Gastroesophageal reflux disease, unspecified whether esophagitis present Yes   Dyspepsia    Special screening for malignant neoplasms, colon    Long term (current) use of antithrombotics/antiplatelets    Esophageal dysphagia     Longstanding dyspepsia and reflux including nocturnal symptoms that are concerning.  He also has intermittent dysphagia suggesting motility disorder, ring/stricture or hiatal hernia. He recalls his last colonoscopy being normal, though timing is uncertain and perhaps as long as 10 years ago.  He is therefore due for colorectal cancer screening.  Upper endoscopy warranted for further evaluation of his upper digestive symptoms. He was agreeable to those procedures after discussion of risks and benefits.  The benefits and risks of the planned procedure were described in detail with the patient or (when appropriate) their health care proxy.  Risks were outlined as including, but not limited to, bleeding, infection, perforation, adverse medication reaction leading to cardiac or pulmonary decompensation, pancreatitis (if ERCP).  The limitation of incomplete mucosal visualization was also discussed.  No guarantees or warranties were given.  We will contact Coumadin clinic for management of his periprocedural anticoagulation, and according  to last cardiology note, it sounds like he will likely need a Lovenox bridge.   Plan: In addition to above, I recommended he avoid eating within 3 to 4 hours of bed and to get a bed wedge to elevate his head while sleeping.  This is at least as important as medicines do controlling nocturnal reflux.   Thank you for the courtesy of this consult.  Please call me with any questions or concerns.  Nelida Meuse III  CC: Referring provider noted above

## 2022-08-06 NOTE — Telephone Encounter (Signed)
Patient with diagnosis of DVT, PE, and triple positive APS on warfarin for anticoagulation.    Procedure: EGD/colonoscopy Date of procedure: 10/06/22  CrCl 36mL/min using adjusted body weight Platelet count 172K  Per office protocol, patient can hold warfarin for 5 days prior to procedure. Patient will need bridging with Lovenox (enoxaparin) around procedure. This will be coordinated at [facility] Coumadin clinic where pt is followed.  **This guidance is not considered finalized until pre-operative APP has relayed final recommendations.**

## 2022-08-13 ENCOUNTER — Other Ambulatory Visit: Payer: Medicare Other

## 2022-08-13 ENCOUNTER — Telehealth: Payer: Self-pay | Admitting: *Deleted

## 2022-08-13 NOTE — Telephone Encounter (Signed)
Called pt and had to leave message for pt to call back regarding INR testing.

## 2022-08-14 ENCOUNTER — Telehealth: Payer: Self-pay

## 2022-08-14 ENCOUNTER — Other Ambulatory Visit: Payer: Self-pay

## 2022-08-14 DIAGNOSIS — Z86718 Personal history of other venous thrombosis and embolism: Secondary | ICD-10-CM

## 2022-08-14 DIAGNOSIS — D6861 Antiphospholipid syndrome: Secondary | ICD-10-CM

## 2022-08-14 NOTE — Telephone Encounter (Signed)
Received call from pt stating he was scheduled to go to [facility] lab for PT/INR; however, he is out of town until Monday. Rescheduled lab appt for Tuesday, 08/18/22.

## 2022-08-18 ENCOUNTER — Other Ambulatory Visit: Payer: Medicare Other

## 2022-08-20 ENCOUNTER — Ambulatory Visit: Payer: Medicare Other | Attending: Internal Medicine

## 2022-08-20 DIAGNOSIS — D6861 Antiphospholipid syndrome: Secondary | ICD-10-CM

## 2022-08-20 DIAGNOSIS — Z86718 Personal history of other venous thrombosis and embolism: Secondary | ICD-10-CM

## 2022-08-20 LAB — PROTIME-INR
INR: 2.8 — ABNORMAL HIGH (ref 0.9–1.2)
Prothrombin Time: 27.3 s — ABNORMAL HIGH (ref 9.1–12.0)

## 2022-08-21 ENCOUNTER — Ambulatory Visit (INDEPENDENT_AMBULATORY_CARE_PROVIDER_SITE_OTHER): Payer: Medicare Other | Admitting: Cardiology

## 2022-08-21 DIAGNOSIS — Z5181 Encounter for therapeutic drug level monitoring: Secondary | ICD-10-CM | POA: Diagnosis not present

## 2022-08-21 DIAGNOSIS — Z7901 Long term (current) use of anticoagulants: Secondary | ICD-10-CM

## 2022-08-21 DIAGNOSIS — Z86711 Personal history of pulmonary embolism: Secondary | ICD-10-CM

## 2022-08-21 DIAGNOSIS — Z86718 Personal history of other venous thrombosis and embolism: Secondary | ICD-10-CM

## 2022-08-22 ENCOUNTER — Other Ambulatory Visit: Payer: Self-pay | Admitting: Student

## 2022-08-27 NOTE — Telephone Encounter (Signed)
Will refill metformin, can we please call patient to have them follow up for their diabetes? Thanks

## 2022-09-07 ENCOUNTER — Other Ambulatory Visit: Payer: Medicare Other

## 2022-09-08 ENCOUNTER — Ambulatory Visit (INDEPENDENT_AMBULATORY_CARE_PROVIDER_SITE_OTHER): Payer: Medicare Other | Admitting: *Deleted

## 2022-09-08 ENCOUNTER — Ambulatory Visit: Payer: Medicare Other | Attending: Internal Medicine

## 2022-09-08 DIAGNOSIS — Z7901 Long term (current) use of anticoagulants: Secondary | ICD-10-CM | POA: Diagnosis not present

## 2022-09-08 DIAGNOSIS — D6861 Antiphospholipid syndrome: Secondary | ICD-10-CM

## 2022-09-08 DIAGNOSIS — Z86711 Personal history of pulmonary embolism: Secondary | ICD-10-CM

## 2022-09-08 DIAGNOSIS — Z5181 Encounter for therapeutic drug level monitoring: Secondary | ICD-10-CM

## 2022-09-08 DIAGNOSIS — Z86718 Personal history of other venous thrombosis and embolism: Secondary | ICD-10-CM | POA: Diagnosis not present

## 2022-09-08 LAB — PROTIME-INR
INR: 3.2 — ABNORMAL HIGH (ref 0.9–1.2)
Prothrombin Time: 30.7 s — ABNORMAL HIGH (ref 9.1–12.0)

## 2022-09-08 NOTE — Patient Instructions (Signed)
Description   INR result 3.2 via fax; Spoke with pt and advised to take 6mg  today then resume taking warfarin 8mg  daily. Recheck INR in 3 weeks at [facility]. NEEDS LOVENOX BRIDGING FOR COLONOSCOPY. Coumadin Clinic [phone]

## 2022-09-13 ENCOUNTER — Other Ambulatory Visit: Payer: Self-pay | Admitting: Internal Medicine

## 2022-09-13 DIAGNOSIS — Z7901 Long term (current) use of anticoagulants: Secondary | ICD-10-CM

## 2022-09-29 ENCOUNTER — Other Ambulatory Visit: Payer: Medicare Other

## 2022-09-30 ENCOUNTER — Ambulatory Visit (INDEPENDENT_AMBULATORY_CARE_PROVIDER_SITE_OTHER): Payer: Medicare Other | Admitting: *Deleted

## 2022-09-30 ENCOUNTER — Encounter: Payer: Self-pay | Admitting: *Deleted

## 2022-09-30 ENCOUNTER — Ambulatory Visit: Payer: Medicare Other | Attending: Internal Medicine

## 2022-09-30 DIAGNOSIS — Z86711 Personal history of pulmonary embolism: Secondary | ICD-10-CM | POA: Diagnosis not present

## 2022-09-30 DIAGNOSIS — Z86718 Personal history of other venous thrombosis and embolism: Secondary | ICD-10-CM

## 2022-09-30 DIAGNOSIS — Z7901 Long term (current) use of anticoagulants: Secondary | ICD-10-CM | POA: Diagnosis not present

## 2022-09-30 DIAGNOSIS — Z5181 Encounter for therapeutic drug level monitoring: Secondary | ICD-10-CM

## 2022-09-30 DIAGNOSIS — D6861 Antiphospholipid syndrome: Secondary | ICD-10-CM

## 2022-09-30 LAB — PROTIME-INR
INR: 2.7 — ABNORMAL HIGH (ref 0.9–1.2)
Prothrombin Time: 25.8 s — ABNORMAL HIGH (ref 9.1–12.0)

## 2022-09-30 MED ORDER — ENOXAPARIN SODIUM 100 MG/ML IJ SOSY: 100.0000 mg | PREFILLED_SYRINGE | Freq: Two times a day (BID) | INTRAMUSCULAR | 1 refills | Status: DC

## 2022-09-30 NOTE — Patient Instructions (Addendum)
Description   Spoke with pt and advised to continue taking warfarin 8mg  daily and refer to pt instructions for upcoming procedure. Recheck INR in 1 week post procedure at [facility]. Coumadin Clinic [phone]       09/30/22: Last dose of warfarin.  10/01/22: No warfarin or enoxaparin (Lovenox).  10/02/22: Inject enoxaparin 100mg  in the fatty abdominal tissue at least 2 inches from the belly button twice a day about 12 hours apart, 8am and 8pm rotate sites. No warfarin.  10/03/22: Inject enoxaparin in the fatty tissue every 12 hours, 8am and 8pm. No warfarin.  10/04/22: Inject enoxaparin in the fatty tissue every 12 hours, 8am and 8pm. No warfarin.  10/05/22: Inject enoxaparin in the fatty tissue in the morning at 8 am (No PM dose). No warfarin.  10/06/22: Procedure Day - No enoxaparin - Resume warfarin in the evening or as directed by doctor (take an extra half tablet of your 5mg  tablet with usual dose, total 10.5mg ).  10/07/22: Resume enoxaparin inject in the fatty tissue every 12 hours and take warfarin (take an extra half tablet of your 5mg  tablet with usual dose, total 10.5mg ).  10/08/22: Inject enoxaparin in the fatty tissue every 12 hours, 8am and 8pm and take warfarin.  10/09/22: Inject enoxaparin in the fatty tissue every 12 hours, 8am and 8pm and take warfarin.  10/10/22: Inject enoxaparin in the fatty tissue every 12 hours, 8am and 8pm and take warfarin.  10/11/22: Inject enoxaparin in the fatty tissue every 12 hours, 8am and 8pm and take warfarin.  10/12/22: Inject enoxaparin in the fatty tissue every 12 hours, 8am and 8pm and take warfarin.  10/13/22: Inject enoxaparin in the fatty tissue every 12 hours, 8am and 8pm and take warfarin.  10/14/22: Inject enoxaparin at 8am and lab appt at 1130am.

## 2022-10-01 ENCOUNTER — Telehealth: Payer: Self-pay | Admitting: Gastroenterology

## 2022-10-01 ENCOUNTER — Telehealth: Payer: Self-pay

## 2022-10-01 NOTE — Telephone Encounter (Signed)
Justin Mueller called the office today and has rescheduled his colonoscopy to 10-28-2022. He will need new Lovenox instructions.  Thank you.

## 2022-10-01 NOTE — Telephone Encounter (Signed)
Patient resch for procedure due to care partner going out of town. Please update prep instructions.

## 2022-10-01 NOTE — Telephone Encounter (Signed)
New instructions have been mailed to the patients home address on file  

## 2022-10-01 NOTE — Telephone Encounter (Addendum)
Called pt in reference to receiving a message that colonoscopy has been canceled and rescheduled until 10/28/22.  Advised pt to not hold warfarin since he is not having the procedure and to continue regular dose of warfarin, which is, 8mg  daily, and he verbalized understanding. Also, rescheduled lab appt for next INR check on 10/21/22 and at that time we can give the lovenox instructions again for new procedure date. He will pick up the lovenox which was already ordered and not use until new instructions are given on 10/21/22. He states he is traveling out of town to a funeral to support a good close friend as the friends son passed away. Expressed condolences and safe travels. He will call if needed.

## 2022-10-05 ENCOUNTER — Other Ambulatory Visit: Payer: Self-pay | Admitting: Internal Medicine

## 2022-10-05 DIAGNOSIS — Z7901 Long term (current) use of anticoagulants: Secondary | ICD-10-CM

## 2022-10-05 NOTE — Telephone Encounter (Signed)
Warfarin 5mg  refill  Last INR Visit 09/30/22 Last OV 02/25/22

## 2022-10-06 ENCOUNTER — Encounter: Payer: Medicare Other | Admitting: Gastroenterology

## 2022-10-14 ENCOUNTER — Other Ambulatory Visit: Payer: Medicare Other

## 2022-10-19 ENCOUNTER — Other Ambulatory Visit: Payer: Self-pay | Admitting: Internal Medicine

## 2022-10-19 DIAGNOSIS — Z86718 Personal history of other venous thrombosis and embolism: Secondary | ICD-10-CM

## 2022-10-19 DIAGNOSIS — Z7901 Long term (current) use of anticoagulants: Secondary | ICD-10-CM

## 2022-10-21 ENCOUNTER — Other Ambulatory Visit: Payer: Medicare Other

## 2022-10-22 ENCOUNTER — Other Ambulatory Visit: Payer: Medicare Other

## 2022-10-22 ENCOUNTER — Telehealth: Payer: Self-pay

## 2022-10-22 NOTE — Telephone Encounter (Signed)
Pt was scheduled to have PT/INR drawn today at [facility] lab. Pt has upcoming procedure next week and needs INR results completed to receive Lovenox bridge instructions. Called pt, no answer. Left message on voicemail to call back as soon as possible to reschedule lab appt.

## 2022-10-23 ENCOUNTER — Telehealth: Payer: Self-pay | Admitting: *Deleted

## 2022-10-23 NOTE — Telephone Encounter (Signed)
Pt due to have INR checked. Called pt. No answer LMOM, to call anticoagulation clinic back.

## 2022-10-26 ENCOUNTER — Telehealth: Payer: Self-pay

## 2022-10-26 NOTE — Telephone Encounter (Signed)
Pt's INR was due on Thursdays 10/22/22. Called pt to make him aware of the importance of having his INR checked and scheduling a lab appt. No answer. Left detailed message on voicemail and provided Anticoagulation Clinic call back number.

## 2022-10-27 ENCOUNTER — Telehealth: Payer: Self-pay | Admitting: Gastroenterology

## 2022-10-27 NOTE — Telephone Encounter (Signed)
Inbound call from patient, has a procedure tomorrow with Dr. Loletha Carrow. Patient is calling to cancel procedure, states his sister is sick in the hospital in [place] and he has to take an emergency flight there. Patient did not want to reschedule at this time, said he would call back later.

## 2022-10-28 ENCOUNTER — Encounter: Payer: Medicare Other | Admitting: Gastroenterology

## 2022-10-29 LAB — PROTIME-INR
INR: 2.8 — ABNORMAL HIGH (ref 0.9–1.2)
Prothrombin Time: 26.6 s — ABNORMAL HIGH (ref 9.1–12.0)

## 2022-11-02 ENCOUNTER — Ambulatory Visit (INDEPENDENT_AMBULATORY_CARE_PROVIDER_SITE_OTHER): Payer: Medicare Other | Admitting: *Deleted

## 2022-11-02 DIAGNOSIS — Z5181 Encounter for therapeutic drug level monitoring: Secondary | ICD-10-CM

## 2022-11-02 DIAGNOSIS — Z86718 Personal history of other venous thrombosis and embolism: Secondary | ICD-10-CM | POA: Diagnosis not present

## 2022-11-02 DIAGNOSIS — Z7901 Long term (current) use of anticoagulants: Secondary | ICD-10-CM

## 2022-11-02 DIAGNOSIS — Z86711 Personal history of pulmonary embolism: Secondary | ICD-10-CM | POA: Diagnosis not present

## 2022-11-02 NOTE — Patient Instructions (Addendum)
  Description   Spoke with pt and advised to continue taking warfarin '8mg'$  daily. Recheck INR in 3 weeks. Let us now the date of procedure since requires lovenox bridge. Coumadin Clinic [phone]

## 2022-11-04 ENCOUNTER — Telehealth: Payer: Self-pay | Admitting: Internal Medicine

## 2022-11-04 DIAGNOSIS — E7800 Pure hypercholesterolemia, unspecified: Secondary | ICD-10-CM

## 2022-11-04 DIAGNOSIS — I2510 Atherosclerotic heart disease of native coronary artery without angina pectoris: Secondary | ICD-10-CM

## 2022-11-04 DIAGNOSIS — E785 Hyperlipidemia, unspecified: Secondary | ICD-10-CM

## 2022-11-04 NOTE — Telephone Encounter (Signed)
Pt needs a referral sent to see a nutritionist. The pt stated they didn't have a particular person or practice they had in mind. Please advise

## 2022-11-05 NOTE — Telephone Encounter (Signed)
Please send a referral to nutrition  Hx of CAD

## 2022-11-09 NOTE — Telephone Encounter (Signed)
Nutrition consult ordered.

## 2022-11-19 ENCOUNTER — Other Ambulatory Visit: Payer: Self-pay | Admitting: Internal Medicine

## 2022-11-19 DIAGNOSIS — Z86718 Personal history of other venous thrombosis and embolism: Secondary | ICD-10-CM

## 2022-11-19 DIAGNOSIS — Z7901 Long term (current) use of anticoagulants: Secondary | ICD-10-CM

## 2022-11-19 NOTE — Telephone Encounter (Signed)
Warfarin 1mg  refill DVT, PE, Antiphospholipid Syndrome Last INR 11/02/22 Last OV 02/26/23

## 2022-11-23 ENCOUNTER — Ambulatory Visit (INDEPENDENT_AMBULATORY_CARE_PROVIDER_SITE_OTHER): Payer: Medicare Other

## 2022-11-23 ENCOUNTER — Ambulatory Visit: Payer: Medicare Other | Attending: Internal Medicine

## 2022-11-23 DIAGNOSIS — Z5181 Encounter for therapeutic drug level monitoring: Secondary | ICD-10-CM | POA: Diagnosis not present

## 2022-11-23 DIAGNOSIS — Z86718 Personal history of other venous thrombosis and embolism: Secondary | ICD-10-CM

## 2022-11-23 DIAGNOSIS — D6861 Antiphospholipid syndrome: Secondary | ICD-10-CM

## 2022-11-23 LAB — PROTIME-INR
INR: 3.2 — ABNORMAL HIGH (ref 0.9–1.2)
Prothrombin Time: 30.8 s — ABNORMAL HIGH (ref 9.1–12.0)

## 2022-11-24 NOTE — Patient Instructions (Signed)
Description   Spoke with pt and advised to only take 5mg  today and then continue taking warfarin 8mg  daily.  Recheck INR in 3 weeks.  Let us now the date of procedure since requires lovenox bridge.  Coumadin Clinic [phone]

## 2022-11-26 ENCOUNTER — Other Ambulatory Visit: Payer: Self-pay | Admitting: Internal Medicine

## 2022-12-14 ENCOUNTER — Encounter: Payer: Self-pay | Admitting: Student

## 2022-12-14 ENCOUNTER — Telehealth: Payer: Self-pay | Admitting: *Deleted

## 2022-12-14 ENCOUNTER — Ambulatory Visit: Payer: Medicare Other

## 2022-12-14 NOTE — Telephone Encounter (Signed)
Called pt since his INR is due today. He states he called today and he changed the appointment. Advised that the appt was not changed and we have not received a message regarding him canceling appt since he cannot make it in today. Advised that I can assist with moving this lab appointment per lab availability. Confirmed appt for 4/18 at 1145a at [facility] lab.

## 2022-12-17 ENCOUNTER — Ambulatory Visit: Payer: Medicare Other | Attending: Internal Medicine

## 2022-12-17 ENCOUNTER — Ambulatory Visit (INDEPENDENT_AMBULATORY_CARE_PROVIDER_SITE_OTHER): Payer: Medicare Other | Admitting: Cardiology

## 2022-12-17 DIAGNOSIS — D6861 Antiphospholipid syndrome: Secondary | ICD-10-CM

## 2022-12-17 DIAGNOSIS — Z86718 Personal history of other venous thrombosis and embolism: Secondary | ICD-10-CM

## 2022-12-17 DIAGNOSIS — Z5181 Encounter for therapeutic drug level monitoring: Secondary | ICD-10-CM | POA: Diagnosis not present

## 2022-12-17 LAB — PROTIME-INR
INR: 2.8 — ABNORMAL HIGH (ref 0.9–1.2)
Prothrombin Time: 26.5 s — ABNORMAL HIGH (ref 9.1–12.0)

## 2022-12-17 NOTE — Patient Instructions (Signed)
Description   Spoke with pt and instructed him to continue taking warfarin  daily.  Recheck INR in 4 weeks.   Coumadin Clinic [phone]

## 2022-12-28 ENCOUNTER — Encounter: Payer: Medicare Other | Attending: Internal Medicine | Admitting: Dietician

## 2022-12-28 ENCOUNTER — Encounter: Payer: Self-pay | Admitting: Dietician

## 2022-12-28 VITALS — Ht 66.0 in | Wt 215.0 lb

## 2022-12-28 DIAGNOSIS — E119 Type 2 diabetes mellitus without complications: Secondary | ICD-10-CM

## 2022-12-28 DIAGNOSIS — E7800 Pure hypercholesterolemia, unspecified: Secondary | ICD-10-CM | POA: Diagnosis present

## 2022-12-28 NOTE — Patient Instructions (Addendum)
Patient's Goals:  Head back to the Gym 2 days per week and walk other days as you are able. Meatless meals more frequently    Plant strong podcast Plant You by [employer]  [email or web address]  on YouTube

## 2022-12-28 NOTE — Progress Notes (Signed)
Medical Nutrition Therapy  Appointment Start time:  52 (patient was late)  Appointment End time:  1145  Primary concerns today: He would like to learn the best diet for his condition .  He states that he eats pretty well now. Referral diagnosis: CAD, pure hypercholesterolemia, dyslipidemia Preferred learning style: no preference indicated Learning readiness:  ready, change in progress  NUTRITION ASSESSMENT   Anthropometrics  66" 215 lbs Wt Readings from Last 3 Encounters:  08/06/22 222 lb 2 oz (100.8 kg)  02/25/22 215 lb 3.2 oz (97.6 kg)  02/02/22 215 lb (97.5 kg)   Clinical Medical Hx: Type 2 Diabetes, CAD, stents, HLD, aniphopholipid syndrome, DVT, PE, HTN Medications: Coumadin, Metformin, Atorvastatin Labs:   Latest Reference Range & Units 07/29/21 16:03 07/30/22 07:54  Cholesterol, Total 100 - 199 mg/dL 161 096  HDL-C >04 mg/dL 39 (L) 33 (L)  HDL Particle Number >=30.5 umol/L 25.2 (L) 26.1 (L)  LDL Particle Number <1,000 nmol/L [id number]  LDL Size >20.5 nm 20.2 (L) 20.5 (L)  LDL-C (NIH Calc) 0 - 99 mg/dL 51 60  Small LDL Particle Number <=527 nmol/L [id number]  Lipoprotein (a) <75.0 nmol/L 104.3 (H)   LP-IR Score <=45  75 (H) 83 (H)  Triglycerides 0 - 149 mg/dL 540 (H) 981  (L): Data is abnormally low (H): Data is abnormally high A1C 6.3% 02/02/2022  Notable Signs/Symptoms: none  Lifestyle & Dietary Hx Patient lives alone.  He does cooking.  Retired Programmer, systems.  Estimated daily fluid intake: a lot Supplements: none Sleep: good 7-8 hours per night Stress / self-care: good Current average weekly physical activity: used to work out at [employer] for 3 days per week but states that he has been lazy and has not been going.  24-Hr Dietary Recall First Meal: oatmeal, fresh fruit OR occasional eggs, grits Snack: none Second Meal: baked chicken, yellow rice, carrots or broccoli Snack: fresh fruit (mangos, blueberries, strawberries), occasional  pastry Third Meal: baked chicken, yellow rice, carrots or broccoli Snack: none Beverages: water, occasional diet soda, unsweetened tea with splenda, occasional no sugar juice   NUTRITION DIAGNOSIS  NB-1.1 Food and nutrition-related knowledge deficit As related to balance of carbohydrate, protein, and fat.  As evidenced by diet hx and patient report.   NUTRITION INTERVENTION  Nutrition education (E-1) on the following topics:  Coumadin and diet.  Consistent intake of high vitamin K foods. Types of fat, saturated fat, fats to avoid and fats to consume in small amounts Exercise, motivation and benefits of exercise Medication review Diet which includes vegetables, whole grains, fruits, beans, fewer processed foods, small portions fish, chicken as desired Effect of diet modification on CAD and diabetes Review of insulin resistance Resources   Handouts Provided Include  [employer] developer of Lifestyle Medicine) packet LDL Cholesterol Nutrition Therapy Diabetes Resources  Learning Style & Readiness for Change Teaching method utilized: Visual & Auditory  Demonstrated degree of understanding via: Teach Back  Barriers to learning/adherence to lifestyle change: none  Goals Established by Pt Patient's Goals:  Head back to the Gym 2 days per week and walk other days as you are able. Meatless meals more frequently    Plant strong podcast Plant You by [employer]  [email or web address]  on YouTube   MONITORING & EVALUATION Dietary intake, weekly physical activity prn.  Next Steps  Patient is to call for questions or appointment as desired.

## 2023-01-12 ENCOUNTER — Other Ambulatory Visit: Payer: Self-pay | Admitting: Student

## 2023-01-14 ENCOUNTER — Ambulatory Visit: Payer: Medicare Other | Attending: Internal Medicine

## 2023-01-14 ENCOUNTER — Ambulatory Visit (INDEPENDENT_AMBULATORY_CARE_PROVIDER_SITE_OTHER): Payer: Medicare Other

## 2023-01-14 DIAGNOSIS — Z86718 Personal history of other venous thrombosis and embolism: Secondary | ICD-10-CM

## 2023-01-14 DIAGNOSIS — Z7901 Long term (current) use of anticoagulants: Secondary | ICD-10-CM

## 2023-01-14 DIAGNOSIS — Z5181 Encounter for therapeutic drug level monitoring: Secondary | ICD-10-CM | POA: Diagnosis not present

## 2023-01-14 DIAGNOSIS — Z86711 Personal history of pulmonary embolism: Secondary | ICD-10-CM | POA: Diagnosis not present

## 2023-01-14 DIAGNOSIS — D6861 Antiphospholipid syndrome: Secondary | ICD-10-CM

## 2023-01-14 LAB — PROTIME-INR
INR: 3.2 — ABNORMAL HIGH (ref 0.9–1.2)
Prothrombin Time: 30.3 s — ABNORMAL HIGH (ref 9.1–12.0)

## 2023-01-14 NOTE — Patient Instructions (Signed)
Description   Spoke with pt and instructed him to only take 5mg  today and then continue taking warfarin 8mg  daily. Recheck INR in 4 weeks.  Coumadin Clinic [phone]

## 2023-01-15 IMAGING — DX DG CHEST 2V
2 series · 2 of 2 positions shown · non-contrast
Comparison: None.

CLINICAL DATA: Cough

EXAM:
CHEST - 2 VIEW

[chest pa]
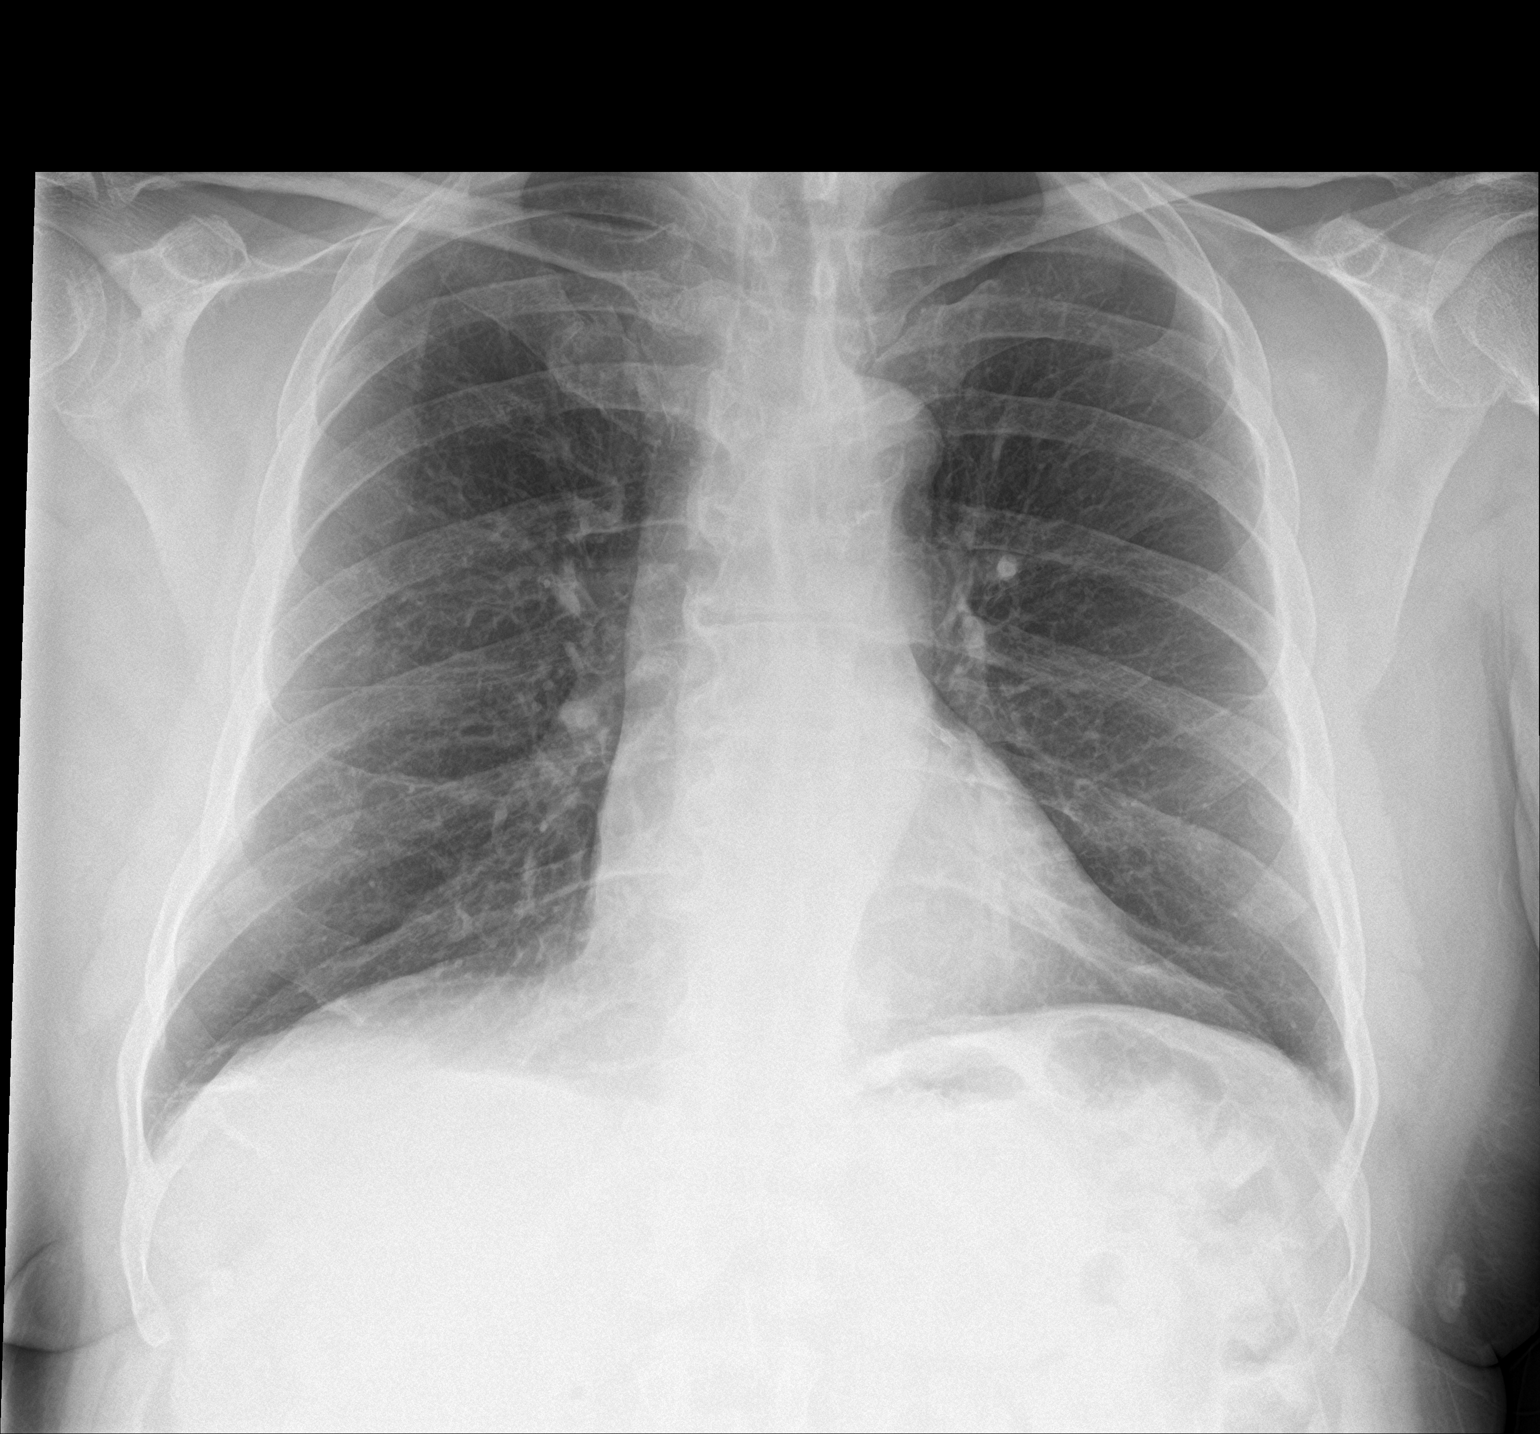

[chest lat]
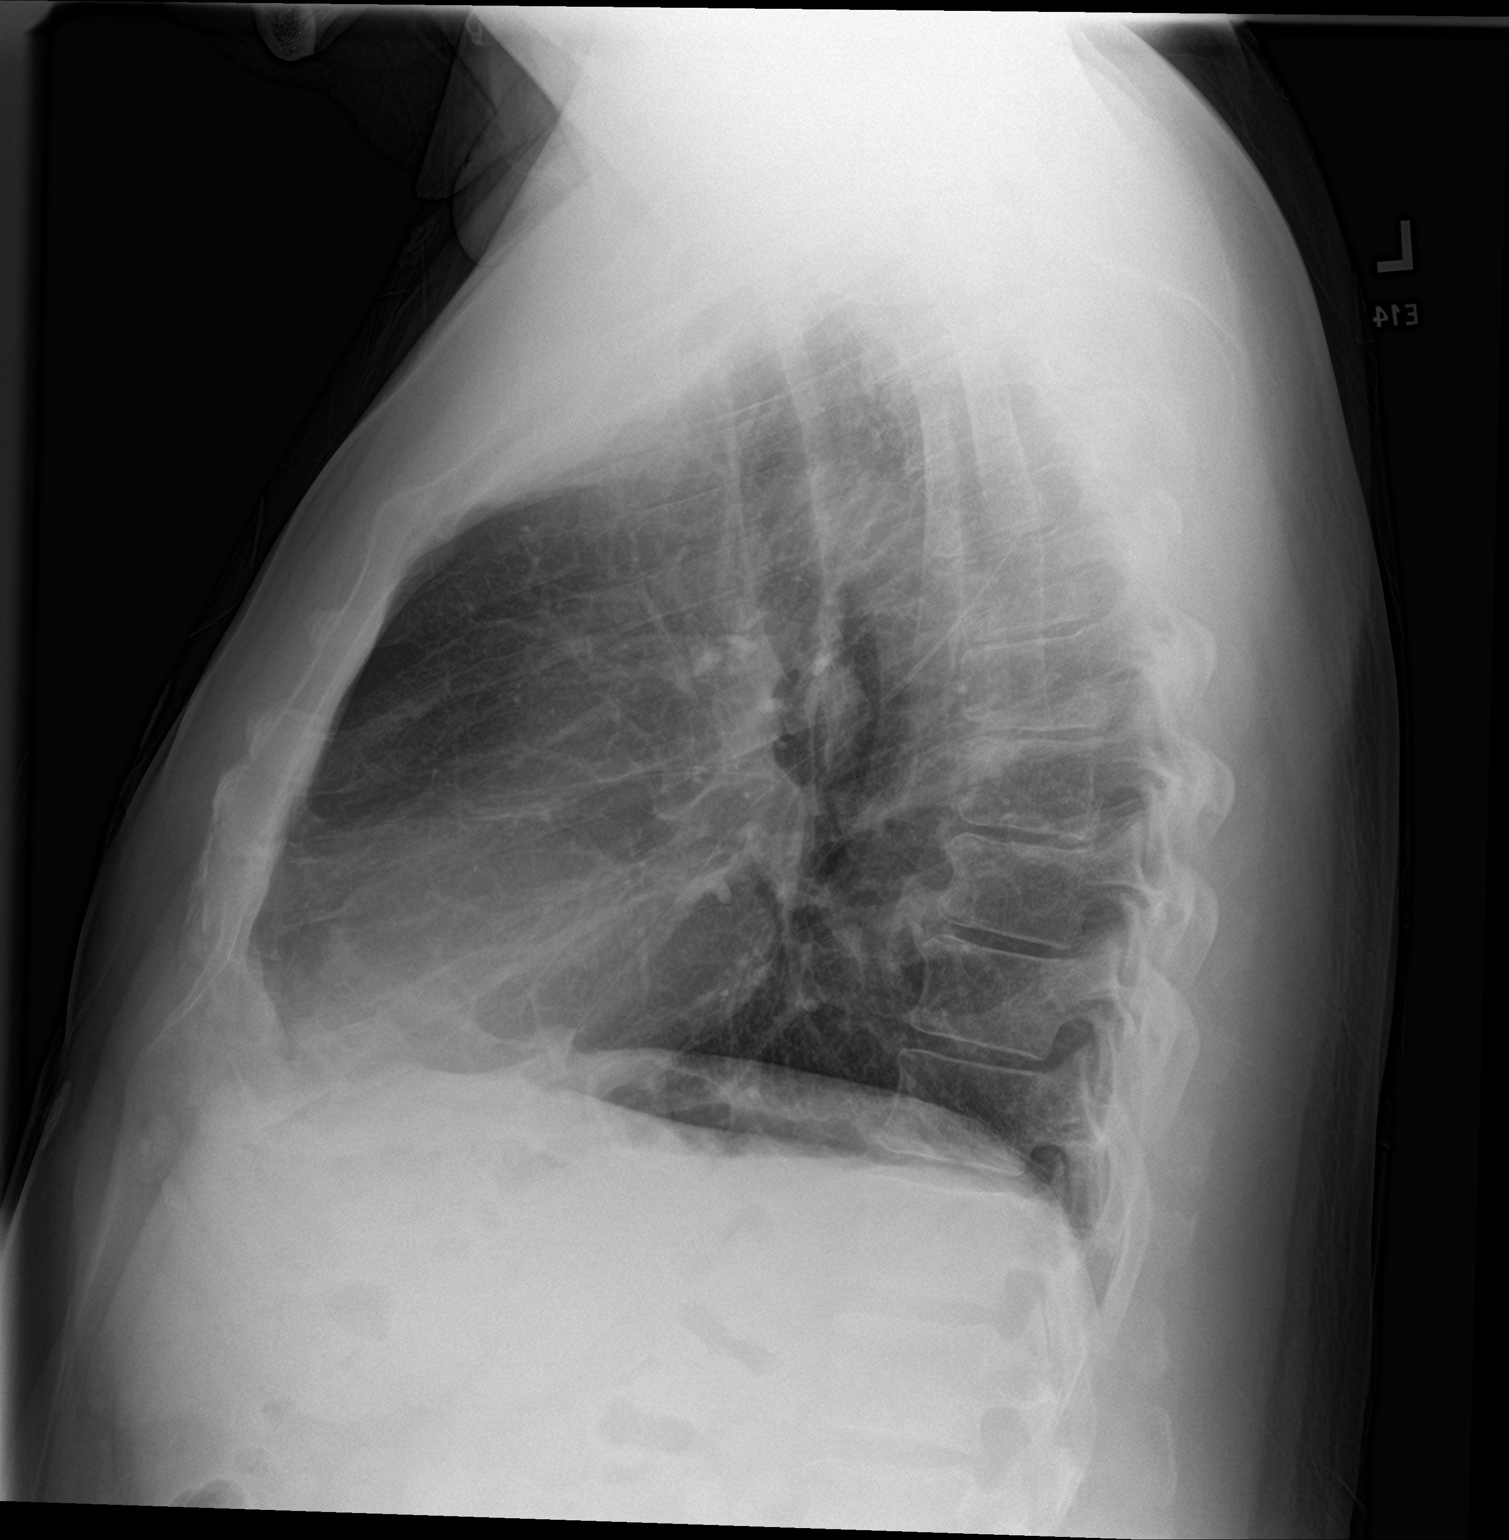

[2 of 2 positions shown; findings below may reference images not displayed]

FINDINGS: Heart and mediastinal contours are within normal limits. No focal
opacities or effusions. No acute bony abnormality.
IMPRESSION: No active cardiopulmonary disease.

## 2023-02-01 ENCOUNTER — Encounter: Payer: Medicare Other | Admitting: Student

## 2023-02-01 ENCOUNTER — Encounter: Payer: Self-pay | Admitting: Student

## 2023-02-03 ENCOUNTER — Encounter: Payer: Self-pay | Admitting: Dietician

## 2023-02-03 NOTE — Progress Notes (Signed)
Opened in error

## 2023-02-04 ENCOUNTER — Ambulatory Visit (INDEPENDENT_AMBULATORY_CARE_PROVIDER_SITE_OTHER): Payer: Medicare Other | Admitting: Student

## 2023-02-04 ENCOUNTER — Ambulatory Visit (INDEPENDENT_AMBULATORY_CARE_PROVIDER_SITE_OTHER): Payer: Medicare Other

## 2023-02-04 ENCOUNTER — Encounter: Payer: Self-pay | Admitting: Student

## 2023-02-04 VITALS — BP 148/78 | HR 64 | Temp 98.7°F | Ht 66.0 in | Wt 220.2 lb

## 2023-02-04 VITALS — BP 138/74 | HR 57 | Temp 98.7°F | Ht 66.0 in | Wt 220.2 lb

## 2023-02-04 DIAGNOSIS — E119 Type 2 diabetes mellitus without complications: Secondary | ICD-10-CM | POA: Diagnosis present

## 2023-02-04 DIAGNOSIS — I10 Essential (primary) hypertension: Secondary | ICD-10-CM

## 2023-02-04 DIAGNOSIS — Z0000 Encounter for general adult medical examination without abnormal findings: Secondary | ICD-10-CM | POA: Diagnosis not present

## 2023-02-04 DIAGNOSIS — I739 Peripheral vascular disease, unspecified: Secondary | ICD-10-CM

## 2023-02-04 DIAGNOSIS — E7800 Pure hypercholesterolemia, unspecified: Secondary | ICD-10-CM | POA: Diagnosis not present

## 2023-02-04 DIAGNOSIS — E1151 Type 2 diabetes mellitus with diabetic peripheral angiopathy without gangrene: Secondary | ICD-10-CM

## 2023-02-04 DIAGNOSIS — Z1211 Encounter for screening for malignant neoplasm of colon: Secondary | ICD-10-CM

## 2023-02-04 DIAGNOSIS — Z1159 Encounter for screening for other viral diseases: Secondary | ICD-10-CM

## 2023-02-04 LAB — GLUCOSE, CAPILLARY: Glucose-Capillary: 133 mg/dL — ABNORMAL HIGH (ref 70–99)

## 2023-02-04 LAB — POCT GLYCOSYLATED HEMOGLOBIN (HGB A1C): Hemoglobin A1C: 7 % — AB (ref 4.0–5.6)

## 2023-02-04 MED ORDER — LISINOPRIL-HYDROCHLOROTHIAZIDE 20-12.5 MG PO TABS
1.0000 | ORAL_TABLET | Freq: Every day | ORAL | 1 refills | Status: DC
Start: 2023-02-04 — End: 2023-06-23

## 2023-02-04 NOTE — Assessment & Plan Note (Addendum)
On lisinopril 10mg  daily. BP 148/78 with repeat BP 138/74. He is above goal given concomitant CAD, T2DM, PAD. He is agreeable with adding HCTZ to regimen so have transitioned him to lisinopril-HCTZ 20-12.5mg  daily for better control. Checking BMP today to evaluate electrolytes and kidney function.  Plan: -transitioned to lisinopril-HCTZ 20-12.5mg  daily -f/u BMP  ADDENDUM: BMP with stable electrolytes and kidney function.

## 2023-02-04 NOTE — Assessment & Plan Note (Addendum)
Patient on metformin 1000mg  BID monotherapy. Last A1c 6.3% 1 year ago. Repeat A1c today at 7%, slight worsening but still well controlled. Foot exam performed today. Ophthalmology referral placed for eye exam. Checking urine ACr today to evaluate for proteinuria. Did increase lisinopril so this should help with proteinuria but if significant, can consider addition of SGLT-2i therapy for further reduction.  Plan: -continue metformin -f/u urine ACr -foot exam completed today -referral placed to ophthalmology -f/u in 3 months for repeat A1c  ADDENDUM: Does have moderate proteinuria on urine ACr. We increased his lisinopril dose which should help. Discussed with him about addition of SGLT-2i therapy to help with proteinuria as well. He does seem interested and would like to speak further about this at next visit.

## 2023-02-04 NOTE — Patient Instructions (Addendum)
Justin Mueller,  It was a pleasure seeing you in the clinic today.   Your blood pressure is higher than where we want it. I am changing your medicine from to a combination medication called lisinopril-HCTZ which will better control your blood pressure. Your A1c is still well controlled but please make sure to avoid any excessive sugar intake or carb intake to better control it. You got your foot exam done today. We are doing several labs today to check your electrolytes, kidney function, cholesterol, and for protein in your urine. I will call you when these are back. Please call the stomach doctor's clinic (listed below) to schedule your colonoscopy. I have placed a referral to the eye doctor for your annual eye exam. Please come back in 3 months for your next visit.   [facility] - Dr. Myrtie Neither [address] [place] [place], [place] [address] [phone]   Please call our clinic at [phone] if you have any questions or concerns. The best time to call is Monday-Friday from 9am-4pm, but there is someone available 24/7 at the same number. If you need medication refills, please notify your pharmacy one week in advance and they will send [place] a request.   Thank you for letting us take part in your care. We look forward to seeing you next time!

## 2023-02-04 NOTE — Progress Notes (Signed)
Subjective:   Justin Mueller is a 72 y.o. male who presents for an Initial Medicare Annual Wellness Visit. I connected with  [name] on 02/04/23 by a  Face-To-Face encounter   and verified that I am speaking with the correct person using two identifiers.  Patient Location: Other:  Office/Clinic  Provider Location: Office/Clinic  I discussed the limitations of evaluation and management by telemedicine. The patient expressed understanding and agreed to proceed.  Review of Systems    Defer to PCP       Objective:    Today's Vitals   02/04/23 1157  BP: (!) 148/78  Pulse: 64  Temp: 98.7 F (37.1 C)  TempSrc: Oral  SpO2: 97%  Weight: 220 lb 3.2 oz (99.9 kg)  Height: 5\' 6"  (1.676 m)   Body mass index is 35.54 kg/m.     02/04/2023   11:59 AM 02/04/2023    8:38 AM 12/28/2022   10:59 AM 02/02/2022   11:31 AM 07/30/2021   11:49 AM 06/23/2021    1:47 PM  Advanced Directives  Does Patient Have a Medical Advance Directive? No No No No No No  Would patient like information on creating a medical advance directive? No - Patient declined No - Patient declined No - Patient declined No - Patient declined No - Patient declined Yes (MAU/Ambulatory/Procedural Areas - Information given)    Current Medications (verified) Outpatient Encounter Medications as of 02/04/2023  Medication Sig   aspirin EC 81 MG tablet Take 81 mg by mouth daily. Swallow whole.   atorvastatin (LIPITOR) 80 MG tablet TAKE 1 TABLET BY MOUTH EVERY DAY   enoxaparin (LOVENOX) 100 MG/ML injection Inject 1 mL (100 mg total) into the skin every 12 (twelve) hours. (Patient not taking: Reported on 12/28/2022)   metFORMIN (GLUCOPHAGE) 1000 MG tablet TAKE 1 TABLET (1,000 MG TOTAL) BY MOUTH TWICE A DAY WITH FOOD   tadalafil (CIALIS) 5 MG tablet Take 5 mg by mouth as needed for erectile dysfunction.   valACYclovir (VALTREX) 500 MG tablet TAKE 1 TABLET (500 MG TOTAL) BY MOUTH 2 (TWO) TIMES DAILY. RASH ON HEAD (Patient not taking:  Reported on 12/28/2022)   warfarin (COUMADIN) 1 MG tablet TAKE 3 TABLETS BY MOUTH DAILY OR AS DIRECTED BY COUMADIN CLINIC   warfarin (COUMADIN) 5 MG tablet TAKE 1 TABLET BY MOUTH DAILY AS DIRECTED BY THE COUMADIN CLINIC OVER 30 DAYS   [DISCONTINUED] lisinopril (ZESTRIL) 10 MG tablet Take 10 mg by mouth daily.   No facility-administered encounter medications on file as of 02/04/2023.    Allergies (verified) Patient has no known allergies.   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)    History of pulmonary embolus (PE)    HTN (hypertension)    Low back pain 11/07/2021   Multiple subsegmental pulmonary emboli without acute cor pulmonale (HCC) 02/11/2021   T2DM (type 2 diabetes mellitus) (HCC)    Viral URI 06/23/2021   Past Surgical History:  Procedure Laterality Date   CHOLECYSTECTOMY  2015   HERNIA REPAIR Bilateral 2015   laparoscopic   Family History  Problem Relation Age of Onset   Cervical cancer Mother    Heart disease Father    Prostate cancer Father    Diabetes Sister    Social History   Socioeconomic History   Marital status: Single    Spouse name: Not on  file   Number of children: Not on file   Years of education: Not on file   Highest education level: Not on file  Occupational History   Occupation: Retired, formerly worked at [employer]  Tobacco Use   Smoking status: Never   Smokeless tobacco: Never  Substance and Sexual Activity   Alcohol use: Yes    Comment: occasional   Drug use: Never   Sexual activity: Not on file  Other Topics Concern   Not on file  Social History Narrative   Recently moved from [place]. Lives by himself, but has a cousin that lives close by.    Social Determinants of Health   Financial Resource Strain: Low Risk  (02/04/2023)   Overall Financial Resource Strain (CARDIA)    Difficulty  of Paying Living Expenses: Not hard at all  Food Insecurity: No Food Insecurity (02/04/2023)   Hunger Vital Sign    Worried About Running Out of Food in the Last Year: Never true    Ran Out of Food in the Last Year: Never true  Transportation Needs: No Transportation Needs (02/04/2023)   PRAPARE - Administrator, Civil Service (Medical): No    Lack of Transportation (Non-Medical): No  Physical Activity: Insufficiently Active (02/04/2023)   Exercise Vital Sign    Days of Exercise per Week: 2 days    Minutes of Exercise per Session: 70 min  Stress: No Stress Concern Present (02/04/2023)   [employer] of Occupational Health - Occupational Stress Questionnaire    Feeling of Stress : Not at all  Social Connections: Moderately Integrated (02/04/2023)   Social Connection and Isolation Panel [NHANES]    Frequency of Communication with Friends and Family: Once a week    Frequency of Social Gatherings with Friends and Family: More than three times a week    Attends Religious Services: 1 to 4 times per year    Active Member of [employer] or Organizations: Yes    Attends Banker Meetings: 1 to 4 times per year    Marital Status: Never married    Tobacco Counseling Counseling given: Not Answered   Clinical Intake:  Pre-visit preparation completed: Yes  Pain : No/denies pain     Nutritional Risks: None Diabetes: Yes CBG done?: Yes CBG resulted in Enter/ Edit results?: Yes Did pt. bring in CBG monitor from home?: No  How often do you need to have someone help you when you read instructions, pamphlets, or other written materials from your doctor or pharmacy?: 1 - Never What is the last grade level you completed in school?: college 4 years  Diabetic?Nutrition Risk Assessment:  Has the patient had any N/V/D within the last 2 months?  No  Does the patient have any non-healing wounds?  No  Has the patient had any unintentional weight loss or weight gain?  No    Diabetes:  Is the patient diabetic?  Yes  If diabetic, was a CBG obtained today?  Yes  Did the patient bring in their glucometer from home?  No    Financial Strains and Diabetes Management:  Are you having any financial strains with the device, your supplies or your medication? No .  Does the patient want to be seen by Chronic Care Management for management of their diabetes?  No  Would the patient like to be referred to a Nutritionist or for Diabetic Management?  No   Diabetic Exams:  Diabetic Eye Exam: Overdue for diabetic eye exam.  Pt has been advised about the importance in completing this exam. Patient advised to call and schedule an eye exam. Diabetic Foot Exam: Completed 02/04/2023    Interpreter Needed?: No  Information entered by :: Kei Langhorst,cma   Activities of Daily Living    02/04/2023   11:59 AM 02/04/2023    8:38 AM  In your present state of health, do you have any difficulty performing the following activities:  Hearing? 0 0  Vision? 0 0  Difficulty concentrating or making decisions? 0 0  Walking or climbing stairs? 0 0  Dressing or bathing? 0 0  Doing errands, shopping? 0 0    Patient Care Team: Merrilyn Puma, MD as PCP - General (Internal Medicine) Pricilla Riffle, MD as PCP - Cardiology (Cardiology)  Indicate any recent Medical Services you may have received from other than [facility] providers in the past year (date may be approximate).     Assessment:   This is a routine wellness examination for [place].  Hearing/Vision screen No results found.  Dietary issues and exercise activities discussed:     Goals Addressed   None   Depression Screen    02/04/2023   11:59 AM 02/04/2023    8:37 AM 12/28/2022   10:58 AM 02/02/2022   11:31 AM 06/23/2021    1:48 PM 06/11/2021    2:25 PM  PHQ 2/9 Scores  PHQ - 2 Score 0 0 0 0 0 0    Fall Risk    02/04/2023   11:59 AM 02/04/2023    8:37 AM 12/28/2022   10:58 AM 02/02/2022   11:31 AM 06/23/2021    1:46 PM   Fall Risk   Falls in the past year? 0 0 0 0 1  Number falls in past yr: 0 0  0 0  Injury with Fall? 0 0  0 0  Risk for fall due to : No Fall Risks No Fall Risks  No Fall Risks History of fall(s);Impaired balance/gait  Follow up Falls evaluation completed;Falls prevention discussed Falls evaluation completed;Falls prevention discussed  Falls evaluation completed;Falls prevention discussed Falls evaluation completed;Falls prevention discussed    FALL RISK PREVENTION PERTAINING TO THE HOME:  Any stairs in or around the home? Yes  If so, are there any without handrails? No  Home free of loose throw rugs in walkways, pet beds, electrical cords, etc? No  Adequate lighting in your home to reduce risk of falls? Yes   ASSISTIVE DEVICES UTILIZED TO PREVENT FALLS:  Life alert? No  Use of a cane, walker or w/c? No  Grab bars in the bathroom? No  Shower chair or bench in shower? No  Elevated toilet seat or a handicapped toilet? No   TIMED UP AND GO:  Was the test performed? No .  Length of time to ambulate 10 feet: 0 sec.   Gait slow and steady without use of assistive device  Cognitive Function:        Immunizations Immunization History  Administered Date(s) Administered   Fluad Quad(high Dose 65+) 06/11/2021   Moderna Sars-Covid-2 Vaccination 09/27/2019, 10/25/2019, 07/02/2020   PNEUMOCOCCAL CONJUGATE-20 02/02/2022    TDAP status: Due, Education has been provided regarding the importance of this vaccine. Advised may receive this vaccine at local pharmacy or Health Dept. Aware to provide a copy of the vaccination record if obtained from local pharmacy or Health Dept. Verbalized acceptance and understanding.  Flu Vaccine status: Up to date  Pneumococcal vaccine status: Up to date  Covid-19 vaccine  status: Completed vaccines  Qualifies for Shingles Vaccine? No   Zostavax completed Yes   Shingrix Completed?: Yes  Screening Tests Health Maintenance  Topic Date Due    Diabetic kidney evaluation - Urine ACR  Never done   Hepatitis C Screening  Never done   DTaP/Tdap/Td (1 - Tdap) Never done   Colonoscopy  Never done   COVID-19 Vaccine (4 - 2023-24 season) 05/01/2022   OPHTHALMOLOGY EXAM  12/27/2022   Diabetic kidney evaluation - eGFR measurement  02/26/2023   INFLUENZA VACCINE  04/01/2023   HEMOGLOBIN A1C  08/06/2023   FOOT EXAM  02/04/2024   Medicare Annual Wellness (AWV)  02/04/2024   Pneumonia Vaccine 29+ Years old  Completed   Zoster Vaccines- Shingrix  Completed   HPV VACCINES  Aged Out    Health Maintenance  Health Maintenance Due  Topic Date Due   Diabetic kidney evaluation - Urine ACR  Never done   Hepatitis C Screening  Never done   DTaP/Tdap/Td (1 - Tdap) Never done   Colonoscopy  Never done   COVID-19 Vaccine (4 - 2023-24 season) 05/01/2022   OPHTHALMOLOGY EXAM  12/27/2022   Diabetic kidney evaluation - eGFR measurement  02/26/2023      Lung Cancer Screening: (Low Dose CT Chest recommended if Age 63-80 years, 30 pack-year currently smoking OR have quit w/in 15years.) does not qualify.   Lung Cancer Screening Referral: N/A  Additional Screening:  Hepatitis C Screening: does not qualify; Completed N/A  Vision Screening: Recommended annual ophthalmology exams for early detection of glaucoma and other disorders of the eye. Is the patient up to date with their annual eye exam?   No Who is the provider or what is the name of the office in which the patient attends annual eye exams? N/A If pt is not established with a provider, would they like to be referred to a provider to establish care? Yes .   Dental Screening: Recommended annual dental exams for proper oral hygiene  Community Resource Referral / Chronic Care Management: CRR required this visit?  Yes   CCM required this visit?  No      Plan:     I have personally reviewed and noted the following in the patient's chart:   Medical and social history Use of alcohol,  tobacco or illicit drugs  Current medications and supplements including opioid prescriptions. Patient is not currently taking opioid prescriptions. Functional ability and status Nutritional status Physical activity Advanced directives List of other physicians Hospitalizations, surgeries, and ER visits in previous 12 months Vitals Screenings to include cognitive, depression, and falls Referrals and appointments  In addition, I have reviewed and discussed with patient certain preventive protocols, quality metrics, and best practice recommendations. A written personalized care plan for preventive services as well as general preventive health recommendations were provided to patient.     Cala Bradford, CMA   02/04/2023   Nurse Notes: Face-To-Face Visit  Mr. Onnen , Thank you for taking time to come for your Medicare Wellness Visit. I appreciate your ongoing commitment to your health goals. Please review the following plan we discussed and let me know if I can assist you in the future.   These are the goals we discussed:  Goals   None     This is a list of the screening recommended for you and due dates:  Health Maintenance  Topic Date Due   Yearly kidney health urinalysis for diabetes  Never done   Hepatitis C Screening  Never done   DTaP/Tdap/Td vaccine (1 - Tdap) Never done   Colon Cancer Screening  Never done   COVID-19 Vaccine (4 - 2023-24 season) 05/01/2022   Eye exam for diabetics  12/27/2022   Yearly kidney function blood test for diabetes  02/26/2023   Flu Shot  04/01/2023   Hemoglobin A1C  08/06/2023   Complete foot exam   02/04/2024   Medicare Annual Wellness Visit  02/04/2024   Pneumonia Vaccine  Completed   Zoster (Shingles) Vaccine  Completed   HPV Vaccine  Aged Out

## 2023-02-04 NOTE — Progress Notes (Signed)
   CC: f/u T2DM, HTN, HLD  HPI:  [name] is a 72 y.o. male with history listed below presenting to the [facility] for f/u T2DM, HTN, HLD. Please see individualized problem based charting for full HPI.  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  Physical Exam:  Vitals:   02/04/23 0835 02/04/23 0903  BP: (!) 148/78 138/74  Pulse: 64 (!) 57  Temp: 98.7 F (37.1 C)   TempSrc: Oral   SpO2: 97%   Weight: 220 lb 3.2 oz (99.9 kg)   Height: 5\' 6"  (1.676 m)    Physical Exam Constitutional:      Appearance: Normal appearance. He is obese. He is not ill-appearing.  HENT:     Mouth/Throat:     Mouth: Mucous membranes are moist.     Pharynx: Oropharynx is clear. No oropharyngeal exudate.  Eyes:     General: No scleral icterus.    Extraocular Movements: Extraocular movements intact.     Conjunctiva/sclera: Conjunctivae normal.     Pupils: Pupils are equal, round, and reactive to light.  Cardiovascular:     Rate and Rhythm: Normal rate and regular rhythm.     Pulses: Normal pulses.     Heart sounds: Normal heart sounds. No murmur heard.    No friction rub. No gallop.  Pulmonary:     Effort: Pulmonary effort is normal.     Breath sounds: Normal breath sounds. No wheezing, rhonchi or rales.  Abdominal:     General: Bowel sounds are normal. There is no distension.     Palpations: Abdomen is soft.     Tenderness: There is no abdominal tenderness. There is no guarding or rebound.  Musculoskeletal:        General: Normal range  of motion.  Skin:    General: Skin is warm and dry.  Neurological:     General: No focal deficit present.     Mental Status: He is alert and oriented to person, place, and time.  Psychiatric:        Mood and Affect: Mood normal.        Behavior: Behavior normal.      Assessment & Plan:   See Encounters Tab for problem based charting.  Patient discussed with Dr. Oswaldo Done

## 2023-02-04 NOTE — Assessment & Plan Note (Addendum)
On lipitor 80mg  daily. Checking lipid panel today to ensure LDL at goal of <70 given PAD.  ADDENDUM: LDL 52, at goal. Continue lipitor.

## 2023-02-04 NOTE — Progress Notes (Signed)
I reviewed the AWV findings with the provider who conducted the visit. I was present in the office suite and immediately available to provide assistance and direction throughout the time the service was provided.  Merrilyn Puma, MD [facility], PGY-3 02/04/2023, 2:28 PM

## 2023-02-04 NOTE — Assessment & Plan Note (Signed)
Referral placed at last visit, patient just has not called to schedule yet. Provided contact information for Dr. Myrtie Neither ([facility]) to schedule colonoscopy.

## 2023-02-05 LAB — LIPID PANEL
HDL: 32 mg/dL — ABNORMAL LOW (ref >39)
LDL Chol Calc (NIH): 58 mg/dL (ref 0–99)
Triglycerides: 143 mg/dL (ref 0–149)
VLDL Cholesterol Cal: 25 mg/dL (ref 5–40)

## 2023-02-05 LAB — BMP8+ANION GAP
Anion Gap: 13 mmol/L (ref 10.0–18.0)
BUN/Creatinine Ratio: 12 (ref 10–24)
Sodium: 138 mmol/L (ref 134–144)

## 2023-02-05 LAB — MICROALBUMIN / CREATININE URINE RATIO
Creatinine, Urine: 126.4 mg/dL
Microalb/Creat Ratio: 84 mg/g creat — ABNORMAL HIGH (ref 0–29)
Microalbumin, Urine: 106.5 ug/mL

## 2023-02-05 NOTE — Progress Notes (Signed)
Internal Medicine Clinic Attending  Case discussed with Dr. Jinwala  At the time of the visit.  We reviewed the resident's history and exam and pertinent patient test results.  I agree with the assessment, diagnosis, and plan of care documented in the resident's note.  

## 2023-02-09 NOTE — Progress Notes (Signed)
Patient called.  Patient aware. Kidney function stable on BMP. LDL at goal. Does have moderate proteinuria but recently increased his lisinopril dose which should help. Also discussed with him about possible addition of SGLT-2i therapy at next visit to help with proteinuria as well and he is agreeable.

## 2023-02-11 ENCOUNTER — Ambulatory Visit: Payer: BLUE CROSS/BLUE SHIELD | Attending: Internal Medicine

## 2023-02-11 LAB — BMP8+ANION GAP
BUN: 16 mg/dL (ref 8–27)
CO2: 24 mmol/L (ref 20–29)
Calcium: 8.9 mg/dL (ref 8.6–10.2)
Chloride: 101 mmol/L (ref 96–106)
Creatinine, Ser: 1.33 mg/dL — ABNORMAL HIGH (ref 0.76–1.27)
Glucose: 129 mg/dL — ABNORMAL HIGH (ref 70–99)
Potassium: 4.9 mmol/L (ref 3.5–5.2)
eGFR: 58 mL/min/{1.73_m2} — ABNORMAL LOW (ref >59)

## 2023-02-11 LAB — LIPID PANEL
Chol/HDL Ratio: 3.6 ratio (ref 0.0–5.0)
Cholesterol, Total: 115 mg/dL (ref 100–199)

## 2023-02-11 LAB — HCV AB W REFLEX TO QUANT PCR: HCV Ab: NONREACTIVE

## 2023-02-11 LAB — HCV INTERPRETATION

## 2023-02-24 ENCOUNTER — Ambulatory Visit: Payer: Medicare Other | Attending: Internal Medicine

## 2023-02-24 ENCOUNTER — Ambulatory Visit (INDEPENDENT_AMBULATORY_CARE_PROVIDER_SITE_OTHER): Payer: Medicare Other

## 2023-02-24 DIAGNOSIS — Z86711 Personal history of pulmonary embolism: Secondary | ICD-10-CM | POA: Diagnosis not present

## 2023-02-24 DIAGNOSIS — D6861 Antiphospholipid syndrome: Secondary | ICD-10-CM

## 2023-02-24 DIAGNOSIS — Z5181 Encounter for therapeutic drug level monitoring: Secondary | ICD-10-CM

## 2023-02-24 DIAGNOSIS — Z86718 Personal history of other venous thrombosis and embolism: Secondary | ICD-10-CM

## 2023-02-24 DIAGNOSIS — Z7901 Long term (current) use of anticoagulants: Secondary | ICD-10-CM

## 2023-02-24 LAB — PROTIME-INR
INR: 2 — ABNORMAL HIGH (ref 0.9–1.2)
Prothrombin Time: 19.5 s — ABNORMAL HIGH (ref 9.1–12.0)

## 2023-02-24 NOTE — Patient Instructions (Signed)
Description   Spoke with pt and instructed him to take 10mg  today and then continue taking warfarin 8mg  daily. Recheck INR in 5 weeks.  C[facility]madin Clinic [phone]

## 2023-03-15 ENCOUNTER — Other Ambulatory Visit: Payer: Self-pay | Admitting: Internal Medicine

## 2023-03-15 DIAGNOSIS — Z7901 Long term (current) use of anticoagulants: Secondary | ICD-10-CM

## 2023-03-31 ENCOUNTER — Telehealth: Payer: Self-pay | Admitting: *Deleted

## 2023-03-31 ENCOUNTER — Ambulatory Visit: Payer: Medicare Other | Attending: Internal Medicine

## 2023-03-31 DIAGNOSIS — Z86718 Personal history of other venous thrombosis and embolism: Secondary | ICD-10-CM

## 2023-03-31 DIAGNOSIS — D6861 Antiphospholipid syndrome: Secondary | ICD-10-CM

## 2023-03-31 LAB — PROTIME-INR
INR: 3.2 — ABNORMAL HIGH (ref 0.9–1.2)
Prothrombin Time: 33.3 s — ABNORMAL HIGH (ref 9.1–12.0)

## 2023-03-31 NOTE — Telephone Encounter (Signed)
Called pt since INR is due; he stated thanks for calling because it was not on his calendar. He stated he would go today by the afternoon. Will monitor for labs and follow up.

## 2023-04-01 ENCOUNTER — Ambulatory Visit (INDEPENDENT_AMBULATORY_CARE_PROVIDER_SITE_OTHER): Payer: Medicare Other

## 2023-04-01 DIAGNOSIS — Z86711 Personal history of pulmonary embolism: Secondary | ICD-10-CM | POA: Diagnosis not present

## 2023-04-01 DIAGNOSIS — Z86718 Personal history of other venous thrombosis and embolism: Secondary | ICD-10-CM

## 2023-04-01 DIAGNOSIS — Z5181 Encounter for therapeutic drug level monitoring: Secondary | ICD-10-CM

## 2023-04-01 NOTE — Patient Instructions (Signed)
Description   Spoke with pt and instructed him to take 6mg  today and then continue taking warfarin 8mg  daily. Recheck INR in 5 weeks.  Coumadin Clinic [phone]

## 2023-04-05 ENCOUNTER — Other Ambulatory Visit: Payer: Self-pay

## 2023-04-05 NOTE — Telephone Encounter (Addendum)
Patient is currently out of med.

## 2023-04-08 MED ORDER — METFORMIN HCL 1000 MG PO TABS: 1000.0000 mg | ORAL_TABLET | Freq: Two times a day (BID) | ORAL | 1 refills | Status: DC

## 2023-04-08 NOTE — Telephone Encounter (Signed)
Please address.

## 2023-04-19 ENCOUNTER — Other Ambulatory Visit: Payer: Self-pay | Admitting: Internal Medicine

## 2023-04-19 DIAGNOSIS — Z7901 Long term (current) use of anticoagulants: Secondary | ICD-10-CM

## 2023-04-30 ENCOUNTER — Telehealth: Payer: Self-pay | Admitting: Internal Medicine

## 2023-04-30 MED ORDER — ATORVASTATIN CALCIUM 80 MG PO TABS: 80.0000 mg | ORAL_TABLET | Freq: Every day | ORAL | 0 refills | Status: DC

## 2023-04-30 NOTE — Telephone Encounter (Signed)
Patient scheduled for 11/19 that was the first available appt we had, he has also be added to the wait list.

## 2023-04-30 NOTE — Telephone Encounter (Signed)
Hey this pt needs an appt. Can you please help him? Thanks

## 2023-04-30 NOTE — Telephone Encounter (Signed)
Pt's medication was sent to pt's pharmacy as requested. Confirmation received.  °

## 2023-04-30 NOTE — Telephone Encounter (Signed)
*  STAT* If patient is at the pharmacy, call can be transferred to refill team.   1. Which medications need to be refilled? (please list name of each medication and dose if known) atorvastatin (LIPITOR) 80 MG tablet    2. Would you like to learn more about the convenience, safety, & potential cost savings by using the [facility]?     3. Are you open to using the [facility] (Type [facility]. No ).   4. Which pharmacy/location (including street and city if local pharmacy) is medication to be sent to? [id number] - [place], [place] - [address]    5. Do they need a 30 day or 90 day supply? 90

## 2023-05-05 ENCOUNTER — Ambulatory Visit (HOSPITAL_COMMUNITY)
Admission: EM | Admit: 2023-05-05 | Discharge: 2023-05-05 | Discharge status: Against Medical Advice | Payer: Medicare Other

## 2023-05-05 ENCOUNTER — Ambulatory Visit: Payer: Medicare Other | Attending: Internal Medicine

## 2023-05-05 ENCOUNTER — Ambulatory Visit (INDEPENDENT_AMBULATORY_CARE_PROVIDER_SITE_OTHER): Payer: Medicare Other | Admitting: *Deleted

## 2023-05-05 DIAGNOSIS — Z5181 Encounter for therapeutic drug level monitoring: Secondary | ICD-10-CM

## 2023-05-05 DIAGNOSIS — Z7901 Long term (current) use of anticoagulants: Secondary | ICD-10-CM | POA: Diagnosis not present

## 2023-05-05 DIAGNOSIS — Z86718 Personal history of other venous thrombosis and embolism: Secondary | ICD-10-CM

## 2023-05-05 DIAGNOSIS — D6861 Antiphospholipid syndrome: Secondary | ICD-10-CM

## 2023-05-05 DIAGNOSIS — Z86711 Personal history of pulmonary embolism: Secondary | ICD-10-CM | POA: Diagnosis not present

## 2023-05-05 LAB — PROTIME-INR
INR: 2.5 — ABNORMAL HIGH (ref 0.9–1.2)
Prothrombin Time: 26.3 s — ABNORMAL HIGH (ref 9.1–12.0)

## 2023-05-05 NOTE — Patient Instructions (Signed)
 Description   Spoke with pt and instructed him to take 6mg  today and then continue taking warfarin 8mg  daily. Recheck INR in 5 weeks.  Coumadin Clinic [phone]

## 2023-05-05 NOTE — ED Notes (Signed)
Called phone number listed in computer for patient, no answer

## 2023-05-05 NOTE — ED Notes (Signed)
No answer in lobby.

## 2023-05-09 ENCOUNTER — Other Ambulatory Visit: Payer: Self-pay | Admitting: Internal Medicine

## 2023-05-09 DIAGNOSIS — Z7901 Long term (current) use of anticoagulants: Secondary | ICD-10-CM

## 2023-05-09 DIAGNOSIS — Z86718 Personal history of other venous thrombosis and embolism: Secondary | ICD-10-CM

## 2023-06-09 ENCOUNTER — Ambulatory Visit: Payer: Medicare Other | Attending: Internal Medicine

## 2023-06-09 ENCOUNTER — Telehealth: Payer: Self-pay | Admitting: *Deleted

## 2023-06-09 DIAGNOSIS — Z86718 Personal history of other venous thrombosis and embolism: Secondary | ICD-10-CM

## 2023-06-09 DIAGNOSIS — D6861 Antiphospholipid syndrome: Secondary | ICD-10-CM

## 2023-06-09 NOTE — Telephone Encounter (Signed)
Called pt since INR is due. He stated he forgot to go but will head over there to [facility] lab very soon. Will await and follow up.

## 2023-06-10 ENCOUNTER — Ambulatory Visit (INDEPENDENT_AMBULATORY_CARE_PROVIDER_SITE_OTHER): Payer: Medicare Other

## 2023-06-10 DIAGNOSIS — Z5181 Encounter for therapeutic drug level monitoring: Secondary | ICD-10-CM

## 2023-06-10 LAB — PROTIME-INR
INR: 2.4 — ABNORMAL HIGH (ref 0.9–1.2)
Prothrombin Time: 25.3 s — ABNORMAL HIGH (ref 9.1–12.0)

## 2023-06-10 NOTE — Patient Instructions (Signed)
Description   Spoke with pt and instructed him to continue taking warfarin 8mg  daily.  Recheck INR in 6 weeks.  Coumadin Clinic [phone]

## 2023-06-22 ENCOUNTER — Other Ambulatory Visit: Payer: Self-pay | Admitting: Internal Medicine

## 2023-06-22 DIAGNOSIS — Z7901 Long term (current) use of anticoagulants: Secondary | ICD-10-CM

## 2023-06-23 ENCOUNTER — Ambulatory Visit: Payer: Medicare Other | Attending: Cardiology | Admitting: Physician Assistant

## 2023-06-23 ENCOUNTER — Encounter: Payer: Self-pay | Admitting: Physician Assistant

## 2023-06-23 VITALS — BP 142/88 | HR 65 | Ht 66.0 in | Wt 220.2 lb

## 2023-06-23 DIAGNOSIS — Z79899 Other long term (current) drug therapy: Secondary | ICD-10-CM | POA: Diagnosis present

## 2023-06-23 DIAGNOSIS — Z7901 Long term (current) use of anticoagulants: Secondary | ICD-10-CM

## 2023-06-23 DIAGNOSIS — Z86718 Personal history of other venous thrombosis and embolism: Secondary | ICD-10-CM | POA: Diagnosis present

## 2023-06-23 DIAGNOSIS — D6861 Antiphospholipid syndrome: Secondary | ICD-10-CM | POA: Diagnosis present

## 2023-06-23 DIAGNOSIS — I4891 Unspecified atrial fibrillation: Secondary | ICD-10-CM

## 2023-06-23 DIAGNOSIS — Z5181 Encounter for therapeutic drug level monitoring: Secondary | ICD-10-CM

## 2023-06-23 DIAGNOSIS — I10 Essential (primary) hypertension: Secondary | ICD-10-CM | POA: Diagnosis not present

## 2023-06-23 DIAGNOSIS — E785 Hyperlipidemia, unspecified: Secondary | ICD-10-CM

## 2023-06-23 DIAGNOSIS — Z86711 Personal history of pulmonary embolism: Secondary | ICD-10-CM | POA: Diagnosis present

## 2023-06-23 DIAGNOSIS — I2510 Atherosclerotic heart disease of native coronary artery without angina pectoris: Secondary | ICD-10-CM | POA: Diagnosis present

## 2023-06-23 MED ORDER — BLOOD PRESSURE CUFF MISC: 0 refills | Status: AC

## 2023-06-23 NOTE — Patient Instructions (Signed)
Medication Instructions:  Your physician recommends that you continue on your current medications as directed. Please refer to the Current Medication list given to you today.  *If you need a refill on your cardiac medications before your next appointment, please call your pharmacy*  Lab Work: None ordered If you have labs (blood work) drawn today and your tests are completely normal, you will receive your results only by: MyChart Message (if you have MyChart) OR A paper copy in the mail If you have any lab test that is abnormal or we need to change your treatment, we will call you to review the results.  Follow-Up: At [facility], you and your health needs are our priority.  As part of our continuing mission to provide you with exceptional heart care, we have created designated Provider Care Teams.  These Care Teams include your primary Cardiologist (physician) and Advanced Practice Providers (APPs -  Physician Assistants and Nurse Practitioners) who all work together to provide you with the care you need, when you need it.  Your next appointment:   6 month(s)  Provider:   Dietrich Pates, MD     Other Instructions Check your blood pressure daily, 1 hr after morning medications for 2 weeks, keep a log and send [place] the readings through mychart at the end of the 2 weeks.   Heart-Healthy Eating Plan Many factors influence your heart health, including eating and exercise habits. Heart health is also called coronary health. Coronary risk increases with abnormal blood fat (lipid) levels. A heart-healthy eating plan includes limiting unhealthy fats, increasing healthy fats, limiting salt (sodium) intake, and making other diet and lifestyle changes. What is my plan? Your health care provider may recommend that: You limit your fat intake to _________% or less of your total calories each day. You limit your saturated fat intake to _________% or less of your total calories each day. You limit  the amount of cholesterol in your diet to less than _________ mg per day. You limit the amount of sodium in your diet to less than _________ mg per day. What are tips for following this plan? Cooking Cook foods using methods other than frying. Baking, boiling, grilling, and broiling are all good options. Other ways to reduce fat include: Removing the skin from poultry. Removing all visible fats from meats. Steaming vegetables in water or broth. Meal planning  At meals, imagine dividing your plate into fourths: Fill one-half of your plate with vegetables and green salads. Fill one-fourth of your plate with whole grains. Fill one-fourth of your plate with lean protein foods. Eat 2-4 cups of vegetables per day. One cup of vegetables equals 1 cup (91 g) broccoli or cauliflower florets, 2 medium carrots, 1 large bell pepper, 1 large sweet potato, 1 large tomato, 1 medium white potato, 2 cups (150 g) raw leafy greens. Eat 1-2 cups of fruit per day. One cup of fruit equals 1 small apple, 1 large banana, 1 cup (237 g) mixed fruit, 1 large orange,  cup (82 g) dried fruit, 1 cup (240 mL) 100% fruit juice. Eat more foods that contain soluble fiber. Examples include apples, broccoli, carrots, beans, peas, and barley. Aim to get 25-30 g of fiber per day. Increase your consumption of legumes, nuts, and seeds to 4-5 servings per week. One serving of dried beans or legumes equals  cup (90 g) cooked, 1 serving of nuts is  oz (12 almonds, 24 pistachios, or 7 walnut halves), and 1 serving of seeds  equals  oz (8 g). Fats Choose healthy fats more often. Choose monounsaturated and polyunsaturated fats, such as olive and canola oils, avocado oil, flaxseeds, walnuts, almonds, and seeds. Eat more omega-3 fats. Choose salmon, mackerel, sardines, tuna, flaxseed oil, and ground flaxseeds. Aim to eat fish at least 2 times each week. Check food labels carefully to identify foods with trans fats or high amounts of  saturated fat. Limit saturated fats. These are found in animal products, such as meats, butter, and cream. Plant sources of saturated fats include palm oil, palm kernel oil, and coconut oil. Avoid foods with partially hydrogenated oils in them. These contain trans fats. Examples are stick margarine, some tub margarines, cookies, crackers, and other baked goods. Avoid fried foods. General information Eat more home-cooked food and less restaurant, buffet, and fast food. Limit or avoid alcohol. Limit foods that are high in added sugar and simple starches such as foods made using white refined flour (white breads, pastries, sweets). Lose weight if you are overweight. Losing just 5-10% of your body weight can help your overall health and prevent diseases such as diabetes and heart disease. Monitor your sodium intake, especially if you have high blood pressure. Talk with your health care provider about your sodium intake. Try to incorporate more vegetarian meals weekly. What foods should I eat? Fruits All fresh, canned (in natural juice), or frozen fruits. Vegetables Fresh or frozen vegetables (raw, steamed, roasted, or grilled). Green salads. Grains Most grains. Choose whole wheat and whole grains most of the time. Rice and pasta, including brown rice and pastas made with whole wheat. Meats and other proteins Lean, well-trimmed beef, veal, pork, and lamb. Chicken and [place] without skin. All fish and shellfish. Wild duck, rabbit, pheasant, and venison. Egg whites or low-cholesterol egg substitutes. Dried beans, peas, lentils, and tofu. Seeds and most nuts. Dairy Low-fat or nonfat cheeses, including ricotta and mozzarella. Skim or 1% milk (liquid, powdered, or evaporated). Buttermilk made with low-fat milk. Nonfat or low-fat yogurt. Fats and oils Non-hydrogenated (trans-free) margarines. Vegetable oils, including soybean, sesame, sunflower, olive, avocado, peanut, safflower, corn, canola, and  cottonseed. Salad dressings or mayonnaise made with a vegetable oil. Beverages Water (mineral or sparkling). Coffee and tea. Unsweetened ice tea. Diet beverages. Sweets and desserts Sherbet, gelatin, and fruit ice. Small amounts of dark chocolate. Limit all sweets and desserts. Seasonings and condiments All seasonings and condiments. The items listed above may not be a complete list of foods and beverages you can eat. Contact a dietitian for more options. What foods should I avoid? Fruits Canned fruit in heavy syrup. Fruit in cream or butter sauce. Fried fruit. Limit coconut. Vegetables Vegetables cooked in cheese, cream, or butter sauce. Fried vegetables. Grains Breads made with saturated or trans fats, oils, or whole milk. Croissants. Sweet rolls. Donuts. High-fat crackers, such as cheese crackers and chips. Meats and other proteins Fatty meats, such as hot dogs, ribs, sausage, bacon, rib-eye roast or steak. High-fat deli meats, such as salami and bologna. Caviar. Domestic duck and goose. Organ meats, such as liver. Dairy Cream, sour cream, cream cheese, and creamed cottage cheese. Whole-milk cheeses. Whole or 2% milk (liquid, evaporated, or condensed). Whole buttermilk. Cream sauce or high-fat cheese sauce. Whole-milk yogurt. Fats and oils Meat fat, or shortening. Cocoa butter, hydrogenated oils, palm oil, coconut oil, palm kernel oil. Solid fats and shortenings, including bacon fat, salt pork, lard, and butter. Nondairy cream substitutes. Salad dressings with cheese or sour cream. Beverages Regular sodas and any drinks  with added sugar. Sweets and desserts Frosting. Pudding. Cookies. Cakes. Pies. Milk chocolate or white chocolate. Buttered syrups. Full-fat ice cream or ice cream drinks. The items listed above may not be a complete list of foods and beverages to avoid. Contact a dietitian for more information. Summary Heart-healthy meal planning includes limiting unhealthy fats,  increasing healthy fats, limiting salt (sodium) intake and making other diet and lifestyle changes. Lose weight if you are overweight. Losing just 5-10% of your body weight can help your overall health and prevent diseases such as diabetes and heart disease. Focus on eating a balance of foods, including fruits and vegetables, low-fat or nonfat dairy, lean protein, nuts and legumes, whole grains, and heart-healthy oils and fats. This information is not intended to replace advice given to you by your health care provider. Make sure you discuss any questions you have with your health care provider. Document Revised: 09/22/2021 Document Reviewed: 09/22/2021 Elsevier Patient Education  2024 [employer] Low-Sodium Eating Plan Salt (sodium) helps you keep a healthy balance of fluids in your body. Too much sodium can raise your blood pressure. It can also cause fluid and waste to be held in your body. Your health care provider or dietitian may recommend a low-sodium eating plan if you have high blood pressure (hypertension), kidney disease, liver disease, or heart failure. Eating less sodium can help lower your blood pressure and reduce swelling. It can also protect your heart, liver, and kidneys. What are tips for following this plan? Reading food labels  Check food labels for the amount of sodium per serving. If you eat more than one serving, you must multiply the listed amount by the number of servings. Choose foods with less than 140 milligrams (mg) of sodium per serving. Avoid foods with 300 mg of sodium or more per serving. Always check how much sodium is in a product, even if the label says "unsalted" or "no salt added." Shopping  Buy products labeled as "low-sodium" or "no salt added." Buy fresh foods. Avoid canned foods and pre-made or frozen meals. Avoid canned, cured, or processed meats. Buy breads that have less than 80 mg of sodium per slice. Cooking  Eat more home-cooked food. Try  to eat less restaurant, buffet, and fast food. Try not to add salt when you cook. Use salt-free seasonings or herbs instead of table salt or sea salt. Check with your provider or pharmacist before using salt substitutes. Cook with plant-based oils, such as canola, sunflower, or olive oil. Meal planning When eating at a restaurant, ask if your food can be made with less salt or no salt. Avoid dishes labeled as brined, pickled, cured, or smoked. Avoid dishes made with soy sauce, miso, or teriyaki sauce. Avoid foods that have monosodium glutamate (MSG) in them. MSG may be added to some restaurant food, sauces, soups, bouillon, and canned foods. Make meals that can be grilled, baked, poached, roasted, or steamed. These are often made with less sodium. General information Try to limit your sodium intake to 1,500-2,300 mg each day, or the amount told by your provider. What foods should I eat? Fruits Fresh, frozen, or canned fruit. Fruit juice. Vegetables Fresh or frozen vegetables. "No salt added" canned vegetables. "No salt added" tomato sauce and paste. Low-sodium or reduced-sodium tomato and vegetable juice. Grains Low-sodium cereals, such as oats, puffed wheat and rice, and shredded wheat. Low-sodium crackers. Unsalted rice. Unsalted pasta. Low-sodium bread. Whole grain breads and whole grain pasta. Meats and other proteins Fresh or frozen  meat, poultry, seafood, and fish. These should have no added salt. Low-sodium canned tuna and salmon. Unsalted nuts. Dried peas, beans, and lentils without added salt. Unsalted canned beans. Eggs. Unsalted nut butters. Dairy Milk. Soy milk. Cheese that is naturally low in sodium, such as ricotta cheese, fresh mozzarella, or Swiss cheese. Low-sodium or reduced-sodium cheese. Cream cheese. Yogurt. Seasonings and condiments Fresh and dried herbs and spices. Salt-free seasonings. Low-sodium mustard and ketchup. Sodium-free salad dressing. Sodium-free light  mayonnaise. Fresh or refrigerated horseradish. Lemon juice. Vinegar. Other foods Homemade, reduced-sodium, or low-sodium soups. Unsalted popcorn and pretzels. Low-salt or salt-free chips. The items listed above may not be all the foods and drinks you can have. Talk to a dietitian to learn more. What foods should I avoid? Vegetables Sauerkraut, pickled vegetables, and relishes. Olives. [place] fries. Onion rings. Regular canned vegetables, except low-sodium or reduced-sodium items. Regular canned tomato sauce and paste. Regular tomato and vegetable juice. Frozen vegetables in sauces. Grains Instant hot cereals. Bread stuffing, pancake, and biscuit mixes. Croutons. Seasoned rice or pasta mixes. Noodle soup cups. Boxed or frozen macaroni and cheese. Regular salted crackers. Self-rising flour. Meats and other proteins Meat or fish that is salted, canned, smoked, spiced, or pickled. Precooked or cured meat, such as sausages or meat loaves. Tomasa Blase. Ham. Pepperoni. Hot dogs. Corned beef. Chipped beef. Salt pork. Jerky. Pickled herring, anchovies, and sardines. Regular canned tuna. Salted nuts. Dairy Processed cheese and cheese spreads. Hard cheeses. Cheese curds. Blue cheese. Feta cheese. String cheese. Regular cottage cheese. Buttermilk. Canned milk. Fats and oils Salted butter. Regular margarine. Ghee. Bacon fat. Seasonings and condiments Onion salt, garlic salt, seasoned salt, table salt, and sea salt. Canned and packaged gravies. Worcestershire sauce. Tartar sauce. Barbecue sauce. Teriyaki sauce. Soy sauce, including reduced-sodium soy sauce. Steak sauce. Fish sauce. Oyster sauce. Cocktail sauce. Horseradish that you find on the shelf. Regular ketchup and mustard. Meat flavorings and tenderizers. Bouillon cubes. Hot sauce. Pre-made or packaged marinades. Pre-made or packaged taco seasonings. Relishes. Regular salad dressings. Salsa. Other foods Salted popcorn and pretzels. Corn chips and puffs. Potato  and tortilla chips. Canned or dried soups. Pizza. Frozen entrees and pot pies. The items listed above may not be all the foods and drinks you should avoid. Talk to a dietitian to learn more. This information is not intended to replace advice given to you by your health care provider. Make sure you discuss any questions you have with your health care provider. Document Revised: 09/03/2022 Document Reviewed: 09/03/2022 Elsevier Patient Education  2024 [employer].

## 2023-06-23 NOTE — Progress Notes (Signed)
Cardiology Office Note:  .   Date:  06/23/2023  ID:  [name], DOB 10/23/1950, MRN [id number] PCP: Annett Fabian, MD  [facility] Providers Cardiologist:  Dietrich Pates, MD {    History of Present Illness: .   Justin Mueller is a 72 y.o. male with a past medical history of CAD, PVD, antiphospholipid antibody, HTN, HLD, type 2 diabetes mellitus who is here for overdue follow-up appointment.  Was seen for the first time by Dr. Tenny Craw follow-up 2022.  Initially from the ER.  Patient has history of CAD.  2018 a cardiac catheterization and is now status post PCI/DES x 2 to diagonal.  Moderate disease of the LAD at that time.  Back in 2022 he was at a different hospital for a pulmonary embolism (multiple segments) this occurred after his knee surgery.  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  On chronic Coumadin.  Per hematology needed Lovenox bridging in future.  Still on chronic anticoagulation and had a right lower extremity DVT by ultrasound June 2022.  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  He was seen July 2023 and noted doing well at that time.  Breathing was okay and denies chest pain.  Exercising 3 days a week.  No shortness of breath.  His diet involved oatmeal and some fresh fruit for breakfast usually skips lunch and meat and corn for dinner.  Tries to limit visits to Coumadin.  Today, he tells me that he has not had any issues with his heart to his knowledge. He told me he is still exercising a few days a week. He has not had any bleeding on the coumadin. No chest pain or SOB. He continues to watch his diet.  He has a hematologist that has been initiated and in a while.  Weight has been about the same but he wants to lose some still.  Reports no shortness of breath nor dyspnea on exertion. Reports no chest pain, pressure, or tightness. No edema, orthopnea, PND. Reports no palpitations.    ROS: Pertinent ROS in HPI  Studies  Reviewed: .        None.      Physical Exam:   VS:  BP (!) 142/88   Pulse 65   Ht 5\' 6"  (1.676 m)   Wt 220 lb 3.2 oz (99.9 kg)   SpO2 95%   BMI 35.54 kg/m    Wt Readings from Last 3 Encounters:  06/23/23 220 lb 3.2 oz (99.9 kg)  02/04/23 220 lb 3.2 oz (99.9 kg)  02/04/23 220 lb 3.2 oz (99.9 kg)    GEN: Well nourished, well developed in no acute distress NECK: No JVD; No carotid bruits CARDIAC: RRR, no murmurs, rubs, gallops RESPIRATORY:  Clear to auscultation without rales, wheezing or rhonchi  ABDOMEN: Soft, non-tender, non-distended EXTREMITIES:  No edema; No deformity   ASSESSMENT AND PLAN: .   1.  CAD status post PCI x 2 and diagnosed SVT -Luckily, no chest pain or shortness of breath -Continue current medication regimen including aspirin 81 mg daily, Lipitor 80 mg daily, lisinopril/HCTZ 20-12.5 mg daily, Coumadin as directed by the Coumadin clinic  2.  History of hypercoagulable state -Patient with positive lupus, positive beta-2 glycoprotein with history of DVT/PE -Followed by the Coumadin clinic on chronic warfarin -watches his vitamin K intake  3.  Hypertension -a little high today in the clinic -No Bp cuff at home, wrote his a script today -He wants to switch his  blood pressure pill to the morning versus the evening.  This is fine to do as long as he is consistent  4. Dyslipidemia -Lab work from the summer reviewed with LDL 58, HDL 32, total cholesterol 657, triglycerides 143, at goal -Continue current medication regimen  5.  PAD -no leg pain and strong pulses today -continue current medications  6.  Diabetes mellitus -A1C of 7 back in the summer -Continue to work on lowering less than 7  7.  Obesity   -staying steady with his weight -He is trying to lose a little bit more and continues his exercise and nutrition    Dispo: He can follow-up in 6 months with Dr. Tenny Craw  Signed, Sharlene Dory, PA-C

## 2023-07-20 ENCOUNTER — Ambulatory Visit: Payer: Medicare Other | Admitting: Cardiology

## 2023-07-21 ENCOUNTER — Telehealth: Payer: Self-pay | Admitting: *Deleted

## 2023-07-21 ENCOUNTER — Ambulatory Visit: Payer: Medicare Other

## 2023-07-21 DIAGNOSIS — Z86718 Personal history of other venous thrombosis and embolism: Secondary | ICD-10-CM

## 2023-07-21 DIAGNOSIS — D6861 Antiphospholipid syndrome: Secondary | ICD-10-CM

## 2023-07-21 NOTE — Telephone Encounter (Signed)
Called pt since INR is due. He states he will not be able to go today but will be able to on Monday. Advised he will need to go on Monday and no later since t is a short week. He states he will do that. Will follow up on Monday.

## 2023-07-26 ENCOUNTER — Other Ambulatory Visit: Payer: Self-pay | Admitting: Internal Medicine

## 2023-07-26 ENCOUNTER — Telehealth: Payer: Self-pay

## 2023-07-26 ENCOUNTER — Ambulatory Visit: Payer: Medicare Other | Attending: Internal Medicine

## 2023-07-26 DIAGNOSIS — Z86718 Personal history of other venous thrombosis and embolism: Secondary | ICD-10-CM

## 2023-07-26 DIAGNOSIS — D6861 Antiphospholipid syndrome: Secondary | ICD-10-CM

## 2023-07-26 LAB — PROTIME-INR: INR: 2 — AB (ref 0.80–1.20)

## 2023-07-26 NOTE — Telephone Encounter (Signed)
Reminded patient to have INR drawn, he may go today.

## 2023-07-27 ENCOUNTER — Ambulatory Visit (INDEPENDENT_AMBULATORY_CARE_PROVIDER_SITE_OTHER): Payer: Medicare Other | Admitting: Internal Medicine

## 2023-07-27 DIAGNOSIS — Z5181 Encounter for therapeutic drug level monitoring: Secondary | ICD-10-CM

## 2023-07-27 DIAGNOSIS — Z86718 Personal history of other venous thrombosis and embolism: Secondary | ICD-10-CM | POA: Diagnosis not present

## 2023-07-27 DIAGNOSIS — Z86711 Personal history of pulmonary embolism: Secondary | ICD-10-CM

## 2023-07-27 DIAGNOSIS — Z7901 Long term (current) use of anticoagulants: Secondary | ICD-10-CM

## 2023-07-27 LAB — PROTIME-INR
INR: 2 — ABNORMAL HIGH (ref 0.9–1.2)
Prothrombin Time: 21 s — ABNORMAL HIGH (ref 9.1–12.0)

## 2023-07-28 ENCOUNTER — Ambulatory Visit: Payer: BLUE CROSS/BLUE SHIELD

## 2023-07-28 DIAGNOSIS — Z86718 Personal history of other venous thrombosis and embolism: Secondary | ICD-10-CM

## 2023-07-28 DIAGNOSIS — D6861 Antiphospholipid syndrome: Secondary | ICD-10-CM

## 2023-08-06 ENCOUNTER — Other Ambulatory Visit: Payer: Self-pay | Admitting: Internal Medicine

## 2023-08-06 DIAGNOSIS — Z86718 Personal history of other venous thrombosis and embolism: Secondary | ICD-10-CM

## 2023-08-06 DIAGNOSIS — Z7901 Long term (current) use of anticoagulants: Secondary | ICD-10-CM

## 2023-09-06 ENCOUNTER — Telehealth: Payer: Self-pay | Admitting: *Deleted

## 2023-09-06 NOTE — Telephone Encounter (Signed)
 Called since INR is due; there was no answer and was sent to voicemail. Left message to have INR drawn at [facility] today or tomorrow morning since inclement weather and advised to call back to let us  know which date. Will await a call back or update from the pt.

## 2023-09-07 ENCOUNTER — Telehealth: Payer: Self-pay | Admitting: *Deleted

## 2023-09-07 NOTE — Telephone Encounter (Signed)
Called pt since INR is overdue; there was no answer so left a message.

## 2023-09-08 ENCOUNTER — Telehealth: Payer: Self-pay

## 2023-09-08 ENCOUNTER — Telehealth: Payer: Self-pay | Admitting: *Deleted

## 2023-09-08 NOTE — Telephone Encounter (Signed)
 Spoke with pt and he states he has been in [place] and will go tomorrow at 1030am to have labs drawn. Will follow up tomorrow.

## 2023-09-08 NOTE — Telephone Encounter (Signed)
INR overdue. Called pt, no answer. Left message on voicemail.

## 2023-09-09 ENCOUNTER — Telehealth: Payer: Self-pay | Admitting: *Deleted

## 2023-09-09 ENCOUNTER — Other Ambulatory Visit: Payer: Self-pay

## 2023-09-09 DIAGNOSIS — Z5181 Encounter for therapeutic drug level monitoring: Secondary | ICD-10-CM

## 2023-09-09 DIAGNOSIS — Z7901 Long term (current) use of anticoagulants: Secondary | ICD-10-CM

## 2023-09-09 NOTE — Telephone Encounter (Signed)
 Called pt to follow up to see if he had his lab drawn today as discussed on yesterday via phone call.   Reordered a STAT INR for now in the event he is at the lab now.

## 2023-09-09 NOTE — Progress Notes (Signed)
 Placed an order INR per note from coumadin clinic: 09/08/23 10:16 AM Note Spoke with pt and he states he has been in [place] and will go tomorrow at 1030am to have labs drawn. Will follow up tomorrow.

## 2023-09-10 ENCOUNTER — Ambulatory Visit (INDEPENDENT_AMBULATORY_CARE_PROVIDER_SITE_OTHER): Payer: Medicare Other

## 2023-09-10 DIAGNOSIS — Z5181 Encounter for therapeutic drug level monitoring: Secondary | ICD-10-CM

## 2023-09-10 DIAGNOSIS — Z86718 Personal history of other venous thrombosis and embolism: Secondary | ICD-10-CM

## 2023-09-10 LAB — PROTIME-INR
INR: 2.8 — ABNORMAL HIGH (ref 0.9–1.2)
Prothrombin Time: 28.7 s — ABNORMAL HIGH (ref 9.1–12.0)

## 2023-09-10 NOTE — Patient Instructions (Signed)
Description   Spoke with pt and instructed him to continue taking warfarin 8mg  daily.  Recheck INR in 6 weeks.  Coumadin Clinic [phone]

## 2023-10-04 ENCOUNTER — Other Ambulatory Visit: Payer: Self-pay

## 2023-10-04 ENCOUNTER — Emergency Department (HOSPITAL_COMMUNITY)
Admission: EM | Admit: 2023-10-04 | Discharge: 2023-10-05 | Disposition: A | Discharge status: Home / Self Care | Payer: Medicare Other | Attending: Emergency Medicine | Admitting: Emergency Medicine

## 2023-10-04 ENCOUNTER — Encounter (HOSPITAL_COMMUNITY): Payer: Self-pay | Admitting: Emergency Medicine

## 2023-10-04 DIAGNOSIS — I6381 Other cerebral infarction due to occlusion or stenosis of small artery: Secondary | ICD-10-CM | POA: Insufficient documentation

## 2023-10-04 DIAGNOSIS — Z7984 Long term (current) use of oral hypoglycemic drugs: Secondary | ICD-10-CM | POA: Diagnosis not present

## 2023-10-04 DIAGNOSIS — E1165 Type 2 diabetes mellitus with hyperglycemia: Secondary | ICD-10-CM | POA: Insufficient documentation

## 2023-10-04 DIAGNOSIS — Z79899 Other long term (current) drug therapy: Secondary | ICD-10-CM | POA: Diagnosis not present

## 2023-10-04 DIAGNOSIS — Z7901 Long term (current) use of anticoagulants: Secondary | ICD-10-CM | POA: Diagnosis not present

## 2023-10-04 DIAGNOSIS — H81399 Other peripheral vertigo, unspecified ear: Secondary | ICD-10-CM | POA: Insufficient documentation

## 2023-10-04 DIAGNOSIS — I10 Essential (primary) hypertension: Secondary | ICD-10-CM | POA: Insufficient documentation

## 2023-10-04 DIAGNOSIS — Z7982 Long term (current) use of aspirin: Secondary | ICD-10-CM | POA: Diagnosis not present

## 2023-10-04 DIAGNOSIS — Y900 Blood alcohol level of less than 20 mg/100 ml: Secondary | ICD-10-CM | POA: Insufficient documentation

## 2023-10-04 LAB — CBC
HCT: 47.2 % (ref 39.0–52.0)
Hemoglobin: 15.9 g/dL (ref 13.0–17.0)
MCH: 30.6 pg (ref 26.0–34.0)
MCHC: 33.7 g/dL (ref 30.0–36.0)
MCV: 90.9 fL (ref 80.0–100.0)
Platelets: 217 10*3/uL (ref 150–400)
RBC: 5.19 MIL/uL (ref 4.22–5.81)
RDW: 14.7 % (ref 11.5–15.5)
WBC: 5.8 10*3/uL (ref 4.0–10.5)
nRBC: 0 % (ref 0.0–0.2)

## 2023-10-04 LAB — COMPREHENSIVE METABOLIC PANEL
ALT: 25 U/L (ref 0–44)
AST: 26 U/L (ref 15–41)
Albumin: 3.9 g/dL (ref 3.5–5.0)
Alkaline Phosphatase: 51 U/L (ref 38–126)
Anion gap: 10 (ref 5–15)
BUN: 15 mg/dL (ref 8–23)
CO2: 22 mmol/L (ref 22–32)
Calcium: 8.9 mg/dL (ref 8.9–10.3)
Chloride: 103 mmol/L (ref 98–111)
Creatinine, Ser: 1.21 mg/dL (ref 0.61–1.24)
GFR, Estimated: >60 mL/min (ref >60)
Glucose, Bld: 117 mg/dL — ABNORMAL HIGH (ref 70–99)
Potassium: 4.5 mmol/L (ref 3.5–5.1)
Sodium: 135 mmol/L (ref 135–145)
Total Bilirubin: 0.6 mg/dL (ref 0.0–1.2)
Total Protein: 7.9 g/dL (ref 6.5–8.1)

## 2023-10-04 LAB — DIFFERENTIAL
Abs Immature Granulocytes: 0.02 10*3/uL (ref 0.00–0.07)
Basophils Absolute: 0 10*3/uL (ref 0.0–0.1)
Basophils Relative: 0 %
Eosinophils Absolute: 0 10*3/uL (ref 0.0–0.5)
Eosinophils Relative: 0 %
Immature Granulocytes: 0 %
Lymphocytes Relative: 24 %
Lymphs Abs: 1.4 10*3/uL (ref 0.7–4.0)
Monocytes Absolute: 0.4 10*3/uL (ref 0.1–1.0)
Monocytes Relative: 7 %
Neutro Abs: 3.9 10*3/uL (ref 1.7–7.7)
Neutrophils Relative %: 69 %

## 2023-10-04 LAB — PROTIME-INR
INR: 2.1 — ABNORMAL HIGH (ref 0.8–1.2)
Prothrombin Time: 23.4 s — ABNORMAL HIGH (ref 11.4–15.2)

## 2023-10-04 LAB — APTT: aPTT: 40 s — ABNORMAL HIGH (ref 24–36)

## 2023-10-04 LAB — ETHANOL: Alcohol, Ethyl (B): <10 mg/dL (ref <10)

## 2023-10-04 MED ORDER — MECLIZINE HCL 25 MG PO TABS
25.0000 mg | ORAL_TABLET | Freq: Once | ORAL | Status: AC
Start: 2023-10-04 — End: 2023-10-04
  Administered 2023-10-04: 25 mg via ORAL
  Filled 2023-10-04: qty 1

## 2023-10-04 NOTE — ED Notes (Signed)
CT notified pt has IV 

## 2023-10-04 NOTE — ED Provider Triage Note (Signed)
Emergency Medicine Provider Triage Evaluation Note  Justin Mueller , a 73 y.o. male  was evaluated in triage.  Pt complains of dizziness beginning last night before bed and worsening this morning upon waking.  Reports 1 episode of nausea and vomiting secondary to dizziness.  Denies chest pain, shortness of breath, abdominal pain, diarrhea, fevers.  Denies one-sided weakness or numbness, headache or neck pain.  Reports history of the same symptoms 6 years ago.  Review of Systems  Positive:  Negative:   Physical Exam  BP (!) 120/98 (BP Location: Right Arm)   Pulse 70   Temp 98.5 F (36.9 C) (Oral)   Resp 18   SpO2 95%  Gen:   Awake, no distress   Resp:  Normal effort  MSK:   Moves extremities without difficulty  Other:  Reassuring neurological examination without focal neurodeficits.  Intact finger-nose, heel-to-shin.  No dysmetria.  Medical Decision Making  Medically screening exam initiated at 10:42 PM.  Appropriate orders placed.  Horris Speros was informed that the remainder of the evaluation will be completed by another provider, this initial triage assessment does not replace that evaluation, and the importance of remaining in the ED until their evaluation is complete.  Workup initiated. CTA head neck ordered.  Will provide patient meclizine.  Stroke labs also ordered and collected.  Patient outside of window.   Al Decant, PA-C 10/04/23 2243

## 2023-10-04 NOTE — ED Triage Notes (Addendum)
Pt LSN: 10/03/23 2330. He woke up this morning and felt dizzy. TV was spinning. Spinning is worse when he moves head down. Nauseated from dizziness and vomited earlier this evening a few times. Ate this afternoon but was unable to keep it down. He is reporting his gait is wobbly at times due to the dizziness. No other neuro deficits. PT takes coumadin for blood clots. Been on it for several years.

## 2023-10-05 ENCOUNTER — Other Ambulatory Visit: Payer: Self-pay

## 2023-10-05 ENCOUNTER — Emergency Department (HOSPITAL_COMMUNITY): Payer: Medicare Other

## 2023-10-05 MED ORDER — SODIUM CHLORIDE 0.9 % IV BOLUS
500.0000 mL | Freq: Once | INTRAVENOUS | Status: AC
Start: 2023-10-05 — End: 2023-10-05
  Administered 2023-10-05: 500 mL via INTRAVENOUS

## 2023-10-05 MED ORDER — MECLIZINE HCL 12.5 MG PO TABS: 12.5000 mg | ORAL_TABLET | Freq: Three times a day (TID) | ORAL | 0 refills | Status: DC | PRN

## 2023-10-05 MED ORDER — DIAZEPAM 2 MG PO TABS: 2.0000 mg | ORAL_TABLET | Freq: Four times a day (QID) | ORAL | 0 refills | Status: AC | PRN

## 2023-10-05 NOTE — ED Provider Notes (Signed)
 [place] EMERGENCY DEPARTMENT AT [facility] Provider Note   CSN: [id number] Arrival date & time: 10/04/23  2106     History  Chief Complaint  Patient presents with   Dizziness    Justin Mueller is a 73 y.o. male who presents with a cc of dizziness.  Patient reports that when he awoke this morning he felt a little bit dizzy.  He turned toward the right to look at TV and he states that the room began spinning.  He got extremely sick to his stomach.  He vomited 3 times.  Since that time he has had room spinning dizziness.  Is better when he keeps his head still, worse when he turns to the right.  He denies severe ataxia but does state that he feels off balance when walking.  He has never had anything like this before.  He denies ringing in his ears.  He denies vision changes unilateral weakness.  He was given meclizine  at triage and states that he feels 80% better.   Dizziness      Home Medications Prior to Admission medications   Medication Sig Start Date End Date Taking? Authorizing Provider  aspirin EC 81 MG tablet Take 81 mg by mouth daily. Swallow whole.    [provider]  atorvastatin  (LIPITOR) 80 MG tablet Take 1 tablet (80 mg total) by mouth daily. 04/30/23   Okey Vina GAILS, MD  Blood Pressure Monitoring (BLOOD PRESSURE CUFF) MISC Check blood pressure as instructed by your physician 06/23/23   Lucien Orren SAILOR, PA-C  lisinopril  (ZESTRIL ) 10 MG tablet Take 10 mg by mouth daily.    [provider]  metFORMIN  (GLUCOPHAGE ) 1000 MG tablet Take 1 tablet (1,000 mg total) by mouth 2 (two) times daily with a meal. 04/08/23   Atway, Rayann N, DO  tadalafil (CIALIS) 5 MG tablet Take 5 mg by mouth as needed for erectile dysfunction.    [provider]  testosterone cypionate (DEPOTESTOSTERONE CYPIONATE) 200 MG/ML injection Inject 200 mg into the skin 2 (two) times a week. 06/21/23   [provider]  valACYclovir  (VALTREX ) 500 MG tablet TAKE 1 TABLET  (500 MG TOTAL) BY MOUTH 2 (TWO) TIMES DAILY. RASH ON HEAD 11/30/22   Jinwala, Sagar H, MD  warfarin (COUMADIN ) 1 MG tablet TAKE 3 TABLETS BY MOUTH DAILY OR AS DIRECTED BY COUMADIN  CLINIC 08/06/23   Ross, Paula V, MD  warfarin (COUMADIN ) 5 MG tablet TAKE 1 TABLET BY MOUTH DAILY AS DIRECTED BY THE COUMADIN  CLINIC OVER 30 DAYS 06/22/23   Okey Vina GAILS, MD      Allergies    Patient has no known allergies.    Review of Systems   Review of Systems  Neurological:  Positive for dizziness.    Physical Exam Updated Vital Signs BP 136/89   Pulse 66   Temp 98 F (36.7 C)   Resp 16   SpO2 94%  Physical Exam Vitals and nursing note reviewed.  Constitutional:      General: He is not in acute distress.    Appearance: He is well-developed. He is not diaphoretic.  HENT:     Head: Normocephalic and atraumatic.  Eyes:     General: No scleral icterus.    Conjunctiva/sclera: Conjunctivae normal.  Cardiovascular:     Rate and Rhythm: Normal rate and regular rhythm.     Heart sounds: Normal heart sounds.  Pulmonary:     Effort: Pulmonary effort is normal. No respiratory distress.  Breath sounds: Normal breath sounds.  Abdominal:     Palpations: Abdomen is soft.     Tenderness: There is no abdominal tenderness.  Musculoskeletal:     Cervical back: Normal range of motion and neck supple.  Skin:    General: Skin is warm and dry.  Neurological:     Mental Status: He is alert.     Comments: Speech is clear and goal oriented, follows commands Major Cranial nerves without deficit, no facial droop Normal strength in upper and lower extremities bilaterally including dorsiflexion and plantar flexion, strong and equal grip strength Sensation normal to light and sharp touch Moves extremities without ataxia, coordination intact Normal finger to nose and rapid alternating movements Neg romberg, no pronator drift Normal gait Normal heel-shin and balance   Psychiatric:        Behavior: Behavior  normal.     ED Results / Procedures / Treatments   Labs (all labs ordered are listed, but only abnormal results are displayed) Labs Reviewed  PROTIME-INR - Abnormal; Notable for the following components:      Result Value   Prothrombin Time 23.4 (*)    INR 2.1 (*)    All other components within normal limits  APTT - Abnormal; Notable for the following components:   aPTT 40 (*)    All other components within normal limits  COMPREHENSIVE METABOLIC PANEL - Abnormal; Notable for the following components:   Glucose, Bld 117 (*)    All other components within normal limits  CBC  DIFFERENTIAL  ETHANOL  CBG MONITORING, ED    EKG None  Radiology MR BRAIN WO CONTRAST Result Date: 10/05/2023 CLINICAL DATA:  Initial evaluation for acute neuro deficit, stroke suspected. EXAM: MRI HEAD WITHOUT CONTRAST TECHNIQUE: Multiplanar, multiecho pulse sequences of the brain and surrounding structures were obtained without intravenous contrast. COMPARISON:  None Available. FINDINGS: Brain: Mild age-related cerebral atrophy. Subcentimeter remote lacunar infarct noted at the left thalamus. No other significant cerebral white matter disease for age. No abnormal foci of restricted diffusion to suggest acute or subacute ischemia. No areas of chronic cortical infarction. No acute or chronic intracranial blood products. No mass lesion, midline shift or mass effect. Mild ventricular prominence related to global parenchymal volume loss without hydrocephalus. No extra-axial fluid collection. Pituitary gland and suprasellar region within normal limits. Vascular: Major intracranial vascular flow voids are maintained. Intracranial circulation is somewhat dolichoectatic in appearance. Skull and upper cervical spine: Craniocervical junctional limits. Bone marrow signal intensity within normal limits. No scalp soft tissue abnormality. Sinuses/Orbits: Globes orbital soft tissues within normal limits. Mild mucosal thickening  noted about the maxillary sinuses. Paranasal sinuses are otherwise largely clear. No mastoid effusion. Other: None. IMPRESSION: 1. No acute intracranial abnormality. 2. Subcentimeter remote left thalamic lacunar infarct. 3. Mild age-related cerebral atrophy. Electronically Signed   By: Morene Hoard M.D.   On: 10/05/2023 02:26    Procedures Procedures    Medications Ordered in ED Medications  meclizine  (ANTIVERT ) tablet 25 mg (25 mg Oral Given 10/04/23 2308)    ED Course/ Medical Decision Making/ A&P                                 Medical Decision Making Amount and/or Complexity of Data Reviewed Labs: ordered. Radiology: ordered.  Risk Prescription drug management.   This patient presents to the ED for concern of dizziness, this involves an extensive number of treatment options, and is  a complaint that carries with it a high risk of complications and morbidity.  The emergent differential diagnosis for acute vertigo low includes peripheral causes such as BPPV, barotrauma, ear foreign body, Mnire's disease, infectious causes such as lip bronchitis, vestibular neuritis or Ramsay Hunt syndrome.  Other emergent causes are central such as cerebellar stroke, vertebrobasilar insufficiency, neoplastic causes, vertebral artery dissection, MS, neurosyphilis or tuberculosis, epilepsy or migraine.  Other causes include anemia, hyperviscosity syndrome, alcohol or aminoglycoside use, renal failure, hypoglycemia and thyroid disease.   Co morbidities: . . 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   Social Determinants of Health:   SDOH  Screenings   Food Insecurity: No Food Insecurity (02/04/2023)  Housing: Low Risk  (02/04/2023)  Transportation Needs: No Transportation Needs (02/04/2023)  Utilities: Not At Risk (02/04/2023)  Alcohol Screen: Low Risk  (02/04/2023)  Depression (PHQ2-9): Low Risk  (02/04/2023)  Financial Resource Strain: Low Risk  (02/04/2023)  Physical Activity: Insufficiently Active (02/04/2023)  Social Connections: Moderately Integrated (02/04/2023)  Stress: No Stress Concern Present (02/04/2023)  Tobacco Use: Low Risk  (10/04/2023)     Additional history:  {Additional history obtained from EMR, triage eval   Lab Tests:  I Ordered, and personally interpreted labs.  The pertinent results include: Mild elevated glucose at 117.  INR 2.1 and therapeutic, CBC ethanol within normal limits.  Imaging Studies:  I ordered imaging studies including MR brain I independently visualized and interpreted imaging which showed no acute findings except an old thalamic lacunar infarct I agree with the radiologist interpretation  Cardiac Monitoring/ECG:  The patient was maintained on a cardiac monitor.  I personally viewed and interpreted the cardiac monitored which showed an underlying rhythm of: Normal sinus rhythm  Medicines ordered and prescription drug management:  I ordered medication including  Medications  meclizine  (ANTIVERT ) tablet 25 mg (25 mg Oral Given 10/04/23 2308)  sodium chloride  0.9 % bolus 500 mL (0 mLs Intravenous Stopped 10/05/23 0433)   for vertigo symptoms Reevaluation of the patient after these medicines showed that the patient  significantly improved I have reviewed the patients home medicines and have made adjustments as needed  Test Considered:   considered CT angiogram however is reported that he has a history of allergy per CT tech to contrast  Critical Interventions:    Consultations Obtained:  Patient here after Problem List / ED Course:     ICD-10-CM   1. Peripheral vertigo,  unspecified laterality  H81.399     2. Lacunar infarction [facility])  I63.81 Ambulatory referral to Neurology      MDM: 73 year old male who presents emergency department with vertigo.  Symptoms are worse with head movement to the right side.  On physical examination he has no acute neurologic deficits, no nystagmus, negative test of skew, no significant imbalance.  Case discussed with Dr. Deedra as above.  MRI of the brain shows old lacunar infarct and this is not contributing to today's symptoms.  No further imaging needed at this time.  Patient may follow-up in the outpatient setting for further assessment of old findings of lacunar infarct.   Dispostion:  After consideration of the diagnostic results and the patients response to treatment, I feel that the patent would  benefit from discharge.PDMP reviewed during this encounter. .         Final Clinical Impression(s) / ED Diagnoses Final diagnoses:  None    Rx / DC Orders ED Discharge Orders     None         Arloa Chroman, PA-C 10/08/23 1632    Carita Senior, MD 10/08/23 MAURINE

## 2023-10-05 NOTE — Discharge Instructions (Addendum)
 Contact a health care provider if: You have a fever. Your condition gets worse or you develop new symptoms. Your family or friends notice any behavioral changes. You have nausea or vomiting that gets worse. You have numbness or a prickling and tingling sensation. Get help right away if you: Have difficulty speaking or moving. Are always dizzy or faint. Develop severe headaches. Have weakness in your legs or arms. Have changes in your hearing or vision. Develop a stiff neck. Develop sensitivity to light. These symptoms may represent a serious problem that is an emergency. Do not wait to see if the symptoms will go away. Get medical help right away. Call your local emergency services (911 in the [place] Do not drive yourself to the hospital.

## 2023-10-11 ENCOUNTER — Encounter: Payer: Self-pay | Admitting: Neurology

## 2023-10-19 ENCOUNTER — Other Ambulatory Visit: Payer: Self-pay | Admitting: Internal Medicine

## 2023-10-19 NOTE — Telephone Encounter (Signed)
 LOV 6/6//2024. Pt was called to schedule an appt. - appt 2/27 with Dr Versie Starks.

## 2023-10-26 ENCOUNTER — Telehealth: Payer: Self-pay | Admitting: *Deleted

## 2023-10-26 ENCOUNTER — Telehealth: Payer: Self-pay

## 2023-10-26 NOTE — Telephone Encounter (Signed)
Lpm to have INR drawn today. 

## 2023-10-26 NOTE — Telephone Encounter (Signed)
 Called pt since INR is overdue. He states he did go today at [employer] or [facility]. Advised will follow up with him once resulted to day or tomorrow and he verbalized understanding.

## 2023-10-27 ENCOUNTER — Ambulatory Visit (INDEPENDENT_AMBULATORY_CARE_PROVIDER_SITE_OTHER): Payer: Self-pay

## 2023-10-27 DIAGNOSIS — Z5181 Encounter for therapeutic drug level monitoring: Secondary | ICD-10-CM | POA: Diagnosis not present

## 2023-10-27 DIAGNOSIS — Z7901 Long term (current) use of anticoagulants: Secondary | ICD-10-CM

## 2023-10-27 DIAGNOSIS — Z86711 Personal history of pulmonary embolism: Secondary | ICD-10-CM

## 2023-10-27 DIAGNOSIS — Z86718 Personal history of other venous thrombosis and embolism: Secondary | ICD-10-CM

## 2023-10-27 LAB — PROTIME-INR
INR: 3.1 — ABNORMAL HIGH (ref 0.9–1.2)
Prothrombin Time: 31.7 s — ABNORMAL HIGH (ref 9.1–12.0)

## 2023-10-28 ENCOUNTER — Encounter: Payer: Self-pay | Admitting: Student

## 2023-10-28 ENCOUNTER — Other Ambulatory Visit: Payer: Self-pay

## 2023-10-28 ENCOUNTER — Ambulatory Visit: Payer: Medicare Other | Admitting: Student

## 2023-10-28 VITALS — BP 117/74 | HR 61 | Temp 97.6°F | Ht 66.0 in | Wt 220.5 lb

## 2023-10-28 DIAGNOSIS — Z7984 Long term (current) use of oral hypoglycemic drugs: Secondary | ICD-10-CM

## 2023-10-28 DIAGNOSIS — D6861 Antiphospholipid syndrome: Secondary | ICD-10-CM

## 2023-10-28 DIAGNOSIS — I10 Essential (primary) hypertension: Secondary | ICD-10-CM | POA: Diagnosis not present

## 2023-10-28 DIAGNOSIS — H8110 Benign paroxysmal vertigo, unspecified ear: Secondary | ICD-10-CM | POA: Insufficient documentation

## 2023-10-28 DIAGNOSIS — E119 Type 2 diabetes mellitus without complications: Secondary | ICD-10-CM

## 2023-10-28 LAB — GLUCOSE, CAPILLARY: Glucose-Capillary: 125 mg/dL — ABNORMAL HIGH (ref 70–99)

## 2023-10-28 LAB — POCT GLYCOSYLATED HEMOGLOBIN (HGB A1C): Hemoglobin A1C: 7.5 % — AB (ref 4.0–5.6)

## 2023-10-28 NOTE — Patient Instructions (Addendum)
 Thank you, [name] for allowing [place] to provide your care today.   As we discussed, your hemoglobin A1c was 7.5% today, meaning your average blood sugar is higher than we would like it to be.  I recommend you get back on your exercise routine for weight loss and cut down on the grape juice.  I also recommend increasing the fiber in your diet including beans, legumes, lentils to decrease your blood sugars. Please keep taking your metformin.  We will recheck your hemoglobin A1c at your next visit in 3 months.  We have collected a urine microalbumin/creatinine test to evaluate your kidney function.  I will call you as soon as we have the results.  I will replace an order for an eye exam.  They should call you to schedule an appointment  I have ordered the following labs for you:   Lab Orders         Microalbumin / Creatinine Urine Ratio         Glucose, capillary         POC Hbg A1C       Follow up: 3 months   We look forward to seeing you next time. Please call our clinic at [phone] if you have any questions or concerns. The best time to call is Monday-Friday from 9am-4pm, but there is someone available 24/7. If after hours or the weekend, call the main hospital number and ask for the Internal Medicine Resident On-Call. If you need medication refills, please notify your pharmacy one week in advance and they will send [place] a request.   Thank you for trusting me with your care. Wishing you the best!   Annett Fabian, MD [facility]

## 2023-10-28 NOTE — Assessment & Plan Note (Addendum)
 BP Readings from Last 3 Encounters:  10/28/23 117/74  10/05/23 131/79  06/23/23 (!) 142/88   Currently well-controlled on lisinopril-hydrochlorothiazide 20-12.5 mg daily.  No concerns with these medications.  Asymptomatic.  His goal is to lose more weight and he hopes to eventually come off of this medication.  He is in a rush today, but we will recheck a BMP at his next follow-up visit.  Will continue his current medication regimen as his blood pressure is well-controlled.

## 2023-10-28 NOTE — Assessment & Plan Note (Signed)
 Recently seen for positional vertigo in the emergency department.  Workup was grossly negative including an MRI brain.  Symptoms improved with meclizine.  He has not had any persistent symptoms.  Follows up with neurology in a few weeks.

## 2023-10-28 NOTE — Progress Notes (Signed)
    CC: Routine Follow Up for HTN, DM after last office visit 02/04/2023  HPI:  Justin Mueller is a 73 y.o. male with pertinent PMH of HTN, T2DM, antiphospholipid syndrome who presents to the clinic for routine f/u. Please see assessment and plan below for further details.  Review of Systems:   Pertinent items noted in HPI and/or A&P.  Physical Exam:  Vitals:   10/28/23 1018  BP: 117/74  Pulse: 61  Temp: 97.6 F (36.4 C)  TempSrc: Oral  SpO2: 98%  Weight: 220 lb 8 oz (100 kg)  Height: 5\' 6"  (1.676 m)   Well-appearing middle-age man in no acute distress Heart rate and rhythm regular, euvolemic, no murmurs Normal respiratory rate and effort, lungs clear to auscultation bilaterally, no focal musculoskeletal or neurologic defects No apparent skin changes  Assessment & Plan:   Hypertension BP Readings from Last 3 Encounters:  10/28/23 117/74  10/05/23 131/79  06/23/23 (!) 142/88   Currently well-controlled on lisinopril-hydrochlorothiazide 20-12.5 mg daily.  No concerns with these medications.  Asymptomatic.  His goal is to lose more weight and he hopes to eventually come off of this medication.  He is in a rush today, but we will recheck a BMP at his next follow-up visit.  Will continue his current medication regimen as his blood pressure is well-controlled.  T2DM (type 2 diabetes mellitus) (HCC) Hemoglobin A1c at his last visit was 7%, up to 7.5% today.  He is currently on metformin at 1000 mg twice daily.  Denies any new polyuria, polydipsia, polyneuropathy.  Due for an eye exam.  Previous referral was placed but he never received a call.  He is on atorvastatin 80 mg daily.  He states that he used to go regularly to [employer], however stopped about 6 months ago when he was feeling bad.  He is motivated to return to the gym regularly.  We will recheck a hemoglobin A1c today and collect a urine microalbumin/creatinine.    For now, we can continue with his metformin 1000 mg  twice daily monotherapy, as his preference is to work on weight loss and dietary changes rather than add additional medications.  Spoke with him about decreasing simple sugars and carbohydrates in his diet, however his diet is very healthy as he reports it.  I recommended he increase the amount of dietary fiber he is not taking with legumes such as beans and lentils to reduce his hyperglycemia.  APS (antiphospholipid syndrome) (HCC) History of chronic DVTs and PEs.  Currently on Coumadin, stable.  Asking about switching to a DOAC, however I recommended continuing on Coumadin given its increased efficacy for antiphospholipid syndrome.  BPPV (benign paroxysmal positional vertigo) Recently seen for positional vertigo in the emergency department.  Workup was grossly negative including an MRI brain.  Symptoms improved with meclizine.  He has not had any persistent symptoms.  Follows up with neurology in a few weeks.   Patient discussed with Dr. Marney Doctor, MD [facility] Internal Medicine Resident PGY-1 Clinic Phone: [phone] Pager: [phone]

## 2023-10-28 NOTE — Assessment & Plan Note (Signed)
 History of chronic DVTs and PEs.  Currently on Coumadin, stable.  Asking about switching to a DOAC, however I recommended continuing on Coumadin given its increased efficacy for antiphospholipid syndrome.

## 2023-10-28 NOTE — Assessment & Plan Note (Addendum)
 Hemoglobin A1c at his last visit was 7%, up to 7.5% today.  He is currently on metformin at 1000 mg twice daily.  Denies any new polyuria, polydipsia, polyneuropathy.  Due for an eye exam.  Previous referral was placed but he never received a call.  He is on atorvastatin 80 mg daily.  He states that he used to go regularly to [employer], however stopped about 6 months ago when he was feeling bad.  He is motivated to return to the gym regularly.  We will recheck a hemoglobin A1c today and collect a urine microalbumin/creatinine.    For now, we can continue with his metformin 1000 mg twice daily monotherapy, as his preference is to work on weight loss and dietary changes rather than add additional medications.  Spoke with him about decreasing simple sugars and carbohydrates in his diet, however his diet is very healthy as he reports it.  I recommended he increase the amount of dietary fiber he is not taking with legumes such as beans and lentils to reduce his hyperglycemia.

## 2023-10-29 LAB — MICROALBUMIN / CREATININE URINE RATIO
Creatinine, Urine: 115.6 mg/dL
Microalb/Creat Ratio: 107 mg/g{creat} — ABNORMAL HIGH (ref 0–29)
Microalbumin, Urine: 124 ug/mL

## 2023-11-03 ENCOUNTER — Encounter: Payer: Self-pay | Admitting: Student

## 2023-11-03 NOTE — Progress Notes (Signed)
 Internal Medicine Clinic Attending  Case discussed with the resident at the time of the visit.  We reviewed the resident's history and exam and pertinent patient test results.  I agree with the assessment, diagnosis, and plan of care documented in the resident's note.

## 2023-11-03 NOTE — Addendum Note (Signed)
 Addended by: Dickie La on: 11/03/2023 09:06 AM   Modules accepted: Level of Service

## 2023-11-08 ENCOUNTER — Telehealth: Payer: Self-pay | Admitting: Internal Medicine

## 2023-11-08 MED ORDER — LISINOPRIL 10 MG PO TABS: 10.0000 mg | ORAL_TABLET | Freq: Every day | ORAL | 1 refills | Status: DC

## 2023-11-08 NOTE — Telephone Encounter (Signed)
 Pt is requesting a refill on medication lisinopril. Dr. Tenny Craw did not prescribe this medication. Would Dr. Tenny Craw like to refill this medication? Please address

## 2023-11-08 NOTE — Telephone Encounter (Signed)
*  STAT* If patient is at the pharmacy, call can be transferred to refill team.   1. Which medications need to be refilled? (please list name of each medication and dose if known) lisinopril (ZESTRIL) 10 MG tablet  2. Which pharmacy/location (including street and city if local pharmacy) is medication to be sent to? [id number] - [place], [place] - [address]  3. Do they need a 30 day or 90 day supply?   90 day supply

## 2023-11-12 ENCOUNTER — Encounter (HOSPITAL_COMMUNITY): Payer: Self-pay

## 2023-11-12 ENCOUNTER — Ambulatory Visit (HOSPITAL_COMMUNITY)
Admission: EM | Admit: 2023-11-12 | Discharge: 2023-11-12 | Disposition: A | Discharge status: Home / Self Care | Attending: Family Medicine | Admitting: Family Medicine

## 2023-11-12 DIAGNOSIS — H5789 Other specified disorders of eye and adnexa: Secondary | ICD-10-CM

## 2023-11-12 MED ORDER — ERYTHROMYCIN 5 MG/GM OP OINT: TOPICAL_OINTMENT | OPHTHALMIC | 0 refills | Status: DC

## 2023-11-12 MED ORDER — FLUORESCEIN SODIUM 1 MG OP STRP
ORAL_STRIP | OPHTHALMIC | Status: AC
  Filled 2023-11-12: qty 1

## 2023-11-12 NOTE — ED Provider Notes (Signed)
 [facility]    CSN: [id number] Arrival date & time: 11/12/23  1146      History   Chief Complaint Chief Complaint  Patient presents with   eye issue    HPI Justin Mueller is a 73 y.o. male.   Patient is here for right eye irritation x 3 days.  Feels like there is something in bottom lid.  The eye is watery.  No redness noted.        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)    History of pulmonary embolus (PE)    HTN (hypertension)    Low back pain 11/07/2021   Multiple subsegmental pulmonary emboli without acute cor pulmonale (HCC) 02/11/2021   T2DM (type 2 diabetes mellitus) (HCC)    Viral URI 06/23/2021    Patient Active Problem List   Diagnosis Date Noted   BPPV (benign paroxysmal positional vertigo) 10/28/2023   Erectile dysfunction 02/02/2022   Need for vaccination with 20-polyvalent pneumococcal conjugate vaccine 02/02/2022   Encounter for screening colonoscopy 02/02/2022   HLD (hyperlipidemia) 09/02/2021   PAD (peripheral artery disease) (HCC) 09/02/2021   History of DVT (deep vein thrombosis) 08/29/2021   History of pulmonary embolism 08/29/2021   Therapeutic drug monitoring 07/29/2021   Anticoagulated on Coumadin 07/29/2021   Hypertension 06/11/2021   T2DM (type 2 diabetes mellitus) (HCC) 06/11/2021   APS (antiphospholipid syndrome) (HCC) 06/11/2021   Right knee injury, sequela 06/11/2021   CAD (coronary artery disease) 06/11/2021    Past Surgical History:  Procedure Laterality Date   CHOLECYSTECTOMY  2015   HERNIA REPAIR Bilateral 2015   laparoscopic       Home Medications    Prior to Admission medications   Medication Sig Start Date End Date Taking? Authorizing Provider  aspirin EC 81 MG tablet Take 81 mg by mouth daily. Swallow whole.    [provider]  atorvastatin (LIPITOR) 80 MG tablet  Take 1 tablet (80 mg total) by mouth daily. 04/30/23   Pricilla Riffle, MD  Blood Pressure Monitoring (BLOOD PRESSURE CUFF) MISC Check blood pressure as instructed by your physician 06/23/23   Sharlene Dory, PA-C  diazepam (VALIUM) 2 MG tablet Take 1 tablet (2 mg total) by mouth every 6 (six) hours as needed (vertigo). 10/05/23   Harris, Cammy Copa, PA-C  lisinopril (ZESTRIL) 10 MG tablet Take 1 tablet (10 mg total) by mouth daily. 11/08/23   Pricilla Riffle, MD  meclizine (ANTIVERT) 12.5 MG tablet Take 1-2 tablets (12.5-25 mg total) by mouth 3 (three) times daily as needed for dizziness. 10/05/23   Arthor Captain, PA-C  metFORMIN (GLUCOPHAGE) 1000 MG tablet TAKE 1 TABLET (1,000 MG TOTAL) BY MOUTH TWICE A DAY WITH FOOD 10/19/23   Annett Fabian, MD  tadalafil (CIALIS) 5 MG tablet Take 5 mg by mouth as needed for erectile dysfunction.    [provider]  testosterone cypionate (DEPOTESTOSTERONE CYPIONATE) 200 MG/ML injection Inject 200 mg into the skin 2 (two) times a week. 06/21/23   [provider]  valACYclovir (VALTREX) 500 MG tablet TAKE 1 TABLET (500 MG TOTAL) BY MOUTH 2 (TWO) TIMES DAILY. RASH ON HEAD 11/30/22   Briscoe Burns, MD  warfarin (COUMADIN) 1 MG tablet TAKE 3 TABLETS BY MOUTH DAILY OR AS DIRECTED BY COUMADIN CLINIC 08/06/23   Pricilla Riffle, MD  warfarin (COUMADIN) 5 MG tablet  TAKE 1 TABLET BY MOUTH DAILY AS DIRECTED BY THE COUMADIN CLINIC OVER 30 DAYS 06/22/23   Pricilla Riffle, MD    Family History Family History  Problem Relation Age of Onset   Cervical cancer Mother    Heart disease Father    Prostate cancer Father    Diabetes Sister     Social History Social History   Tobacco Use   Smoking status: Never   Smokeless tobacco: Never  Vaping Use   Vaping status: Never Used  Substance Use Topics   Alcohol use: Yes    Comment: occasional   Drug use: Never     Allergies   Patient has no known allergies.   Review of Systems Review of Systems   Constitutional: Negative.   HENT: Negative.    Eyes:  Positive for pain and discharge.  Respiratory: Negative.    Cardiovascular: Negative.   Gastrointestinal: Negative.   Musculoskeletal: Negative.   Psychiatric/Behavioral: Negative.       Physical Exam Triage Vital Signs ED Triage Vitals  Encounter Vitals Group     BP 11/12/23 1205 (!) 142/85     Systolic BP Percentile --      Diastolic BP Percentile --      Pulse Rate 11/12/23 1205 60     Resp 11/12/23 1205 16     Temp 11/12/23 1205 98.4 F (36.9 C)     Temp Source 11/12/23 1205 Oral     SpO2 11/12/23 1205 94 %     Weight --      Height --      Head Circumference --      Peak Flow --      Pain Score 11/12/23 1207 6     Pain Loc --      Pain Education --      Exclude from Growth Chart --    No data found.  Updated Vital Signs BP (!) 142/85 (BP Location: Left Arm)   Pulse 60   Temp 98.4 F (36.9 C) (Oral)   Resp 16   SpO2 94%   Visual Acuity Right Eye Distance:   Left Eye Distance:   Bilateral Distance:    Right Eye Near:   Left Eye Near:    Bilateral Near:     Physical Exam Constitutional:      Appearance: Normal appearance. He is normal weight.  Eyes:     Comments: No obvious redness or inflammation to the right eye sclera or conjunctiva;  perrla Flourescein stain was negative for FB or abrasion to the eye or conjunctiva  Neurological:     General: No focal deficit present.     Mental Status: He is alert.  Psychiatric:        Mood and Affect: Mood normal.      UC Treatments / Results  Labs (all labs ordered are listed, but only abnormal results are displayed) Labs Reviewed - No data to display  EKG   Radiology No results found.  Procedures Procedures (including critical care time)  Medications Ordered in UC Medications - No data to display  Initial Impression / Assessment and Plan / UC Course  I have reviewed the triage vital signs and the nursing notes.  Pertinent labs &  imaging results that were available during my care of the patient were reviewed by me and considered in my medical decision making (see chart for details).   Final Clinical Impressions(s) / UC Diagnoses   Final diagnoses:  Eye irritation  Discharge Instructions      You were seen today for an irritation to the eye/eye lid.  I do not see a foreign body, or evidence of infection.  However, I have sent out an eye ointment to help.  If this does not improve then please visit an eye specialist for further evaluation.     ED Prescriptions     Medication Sig Dispense Auth. Provider   erythromycin ophthalmic ointment Place a 1/2 inch ribbon of ointment into the lower eyelid tid x 7 days 3.5 g Jannifer Franklin, MD      PDMP not reviewed this encounter.   Jannifer Franklin, MD 11/12/23 1233

## 2023-11-12 NOTE — ED Triage Notes (Signed)
 Patient reports that he feels like he has something is in his right eye and notices it on the bottom lid x 3 days. Right eye is watery.  Patient has not used any treatments or medications for his symptoms.

## 2023-11-12 NOTE — Discharge Instructions (Signed)
 You were seen today for an irritation to the eye/eye lid.  I do not see a foreign body, or evidence of infection.  However, I have sent out an eye ointment to help.  If this does not improve then please visit an eye specialist for further evaluation.

## 2023-11-16 ENCOUNTER — Encounter: Payer: Self-pay | Admitting: Neurology

## 2023-11-16 ENCOUNTER — Ambulatory Visit (INDEPENDENT_AMBULATORY_CARE_PROVIDER_SITE_OTHER): Payer: Medicare Other | Admitting: Neurology

## 2023-11-16 VITALS — BP 140/83 | HR 71 | Ht 66.0 in | Wt 220.4 lb

## 2023-11-16 DIAGNOSIS — H9201 Otalgia, right ear: Secondary | ICD-10-CM

## 2023-11-16 DIAGNOSIS — Z8673 Personal history of transient ischemic attack (TIA), and cerebral infarction without residual deficits: Secondary | ICD-10-CM

## 2023-11-16 DIAGNOSIS — H8111 Benign paroxysmal vertigo, right ear: Secondary | ICD-10-CM

## 2023-11-16 NOTE — Progress Notes (Signed)
 NEUROLOGY CONSULTATION NOTE  Justin Mueller MRN: [id number] DOB: [birth year]  Referring provider: Arthor Captain, PA-C (ER) Primary care provider: Dr. Annett Fabian  Reason for consult:  old lacunar infarct on brain MRI  Thank you for your kind referral of Justin Mueller for consultation of the above symptoms. Although his history is well known to you, please allow me to reiterate it for the purpose of our medical record. He is alone in the office today. Records and images were personally reviewed where available.   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 He was in his usual state of health until 10/04/23 when he woke up feeling dizzy, his head was hurting. He turned to the right to look at the TV and it was spinning in front of him. He was nauseated and vomited 3 times. When he was able to stand up, he felt off balance and went to the ER. No falls. Bloodwork was unremarkable, INR 2.1. I personally reviewed MRI brain without contrast which did not show any acute changes, there was a very small chronic lacunar infarct in the left thalamus, mild diffuse atrophy. He denies any prior history of stroke that he knows of. He was given meclizine and was 80% better on ER discharge, he was back to normal the next day. He denies any further vertigo since then, but has had a constant soreness in and under his right ear. No hearing loss or tinnitus. He denies any recent infections or head injuries. He denies any history of regular headaches, no prior history of vertigo, no diplopia, dysarthria/dysphagia, focal numbness/tingling/weakness. He has been on Coumadin for a couple of years. BP today 140/83, he takes his medications in the evenings.     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 Swallow whole.     atorvastatin (LIPITOR) 80 MG tablet Take 1 tablet (80 mg total) by mouth daily. 90 tablet 0   Blood Pressure Monitoring (BLOOD PRESSURE CUFF) MISC Check blood pressure as instructed by your physician 1 each 0   diazepam (VALIUM) 2 MG tablet Take 1 tablet (2 mg total) by mouth every 6 (six) hours as needed (vertigo). 12 tablet 0   erythromycin ophthalmic ointment Place a 1/2 inch ribbon of ointment into the lower eyelid tid x 7 days 3.5 g 0   lisinopril (ZESTRIL) 10 MG tablet Take 1 tablet (10 mg total) by mouth daily. 90 tablet 1   meclizine (ANTIVERT) 12.5 MG tablet Take 1-2 tablets (12.5-25 mg total) by mouth 3 (three) times daily as needed for  dizziness. 30 tablet 0   metFORMIN (GLUCOPHAGE) 1000 MG tablet TAKE 1 TABLET (1,000 MG TOTAL) BY MOUTH TWICE A DAY WITH FOOD 180 tablet 1   tadalafil (CIALIS) 5 MG tablet Take 5 mg by mouth as needed for erectile dysfunction.     testosterone cypionate (DEPOTESTOSTERONE CYPIONATE) 200 MG/ML injection Inject 200 mg into the skin 2 (two) times a week.     valACYclovir (VALTREX) 500 MG tablet TAKE 1 TABLET (500 MG TOTAL) BY MOUTH 2 (TWO) TIMES DAILY. RASH ON HEAD 180 tablet 1   warfarin (COUMADIN) 1 MG tablet TAKE 3 TABLETS BY MOUTH DAILY  OR AS DIRECTED BY COUMADIN CLINIC 270 tablet 1   warfarin (COUMADIN) 5 MG tablet TAKE 1 TABLET BY MOUTH DAILY AS DIRECTED BY THE COUMADIN CLINIC OVER 30 DAYS 90 tablet 2   No current facility-administered medications on file prior to visit.    ALLERGIES: No Known Allergies  FAMILY HISTORY: Family History  Problem Relation Age of Onset   Cervical cancer Mother    Heart disease Father    Prostate cancer Father    Diabetes Sister     SOCIAL HISTORY: Social History   Socioeconomic History   Marital status: Single    Spouse name: Not on file   Number of children: Not on file   Years of education: Not on file   Highest education level: Not on file  Occupational History   Occupation: Retired, formerly worked at [employer]  Tobacco Use   Smoking status: Never   Smokeless tobacco: Never  Vaping Use   Vaping status: Never Used  Substance and Sexual Activity   Alcohol use: Yes    Comment: occasional   Drug use: Never   Sexual activity: Not on file  Other Topics Concern   Not on file  Social History Narrative   Recently moved from [place]. Lives by himself, but has a cousin that lives close by.    Right handed    Retired    Teacher, early years/pre Strain: Low Risk  (02/04/2023)   Overall Financial Resource Strain (CARDIA)    Difficulty of Paying Living Expenses: Not hard at all  Food Insecurity: No Food Insecurity (02/04/2023)   Hunger Vital Sign    Worried About Running Out of Food in the Last Year: Never true    Ran Out of Food in the Last Year: Never true  Transportation Needs: No Transportation Needs (02/04/2023)   PRAPARE - Administrator, Civil Service (Medical): No    Lack of Transportation (Non-Medical): No  Physical Activity: Insufficiently Active (02/04/2023)   Exercise Vital Sign    Days of Exercise per Week: 2 days    Minutes of Exercise per Session: 70 min  Stress: No Stress Concern Present (02/04/2023)   [employer] of Occupational Health - Occupational Stress Questionnaire    Feeling of Stress : Not at all  Social Connections: Moderately Integrated (02/04/2023)   Social Connection and Isolation Panel [NHANES]    Frequency of Communication with Friends and Family: Once a week    Frequency of Social Gatherings with Friends and Family: More than three times a week    Attends Religious Services: 1 to 4 times per year    Active Member of [employer] or Organizations: Yes    Attends Banker Meetings: 1 to 4 times per year    Marital Status: Never married  Intimate Partner Violence: Not At Risk (  02/04/2023)   Humiliation, Afraid, Rape, and Kick questionnaire    Fear of Current or Ex-Partner: No    Emotionally Abused: No    Physically Abused: No    Sexually Abused: No     PHYSICAL EXAM: Vitals:   11/16/23 0916  BP: (!) 140/83  Pulse: 71  SpO2: 95%   General: No acute distress Head:  Normocephalic/atraumatic Skin/Extremities: No rash, no edema Neurological Exam: Mental status: alert and awake, no dysarthria or aphasia, Fund of knowledge is appropriate.   Attention and concentration are normal.    Cranial nerves: CN I: not tested CN II: pupils equal, round, visual fields intact CN III, IV, VI:  full range of motion, no nystagmus, no ptosis CN V: facial sensation intact CN VII: upper and lower face symmetric CN VIII: hearing intact to conversation CN XI: sternocleidomastoid and trapezius muscles intact CN XII: tongue midline Bulk & Tone: normal, no fasciculations. Motor: 5/5 throughout with no pronator drift. Sensation: intact to light touch, cold, pin, vibration sense.  No extinction to double simultaneous stimulation.  Romberg test negative Deep Tendon Reflexes: +1 throughout Cerebellar: no incoordination on finger to nose, heel to shin testing, normal RAMs Gait: narrow-based and steady, able to tandem walk adequately. 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 He denies any prior history of stroke, his neurological exam today is normal. We discussed this was an incidental finding. We discussed the pathophysiology of lacunar strokes, typically due to small vessel disease, continue control of vascular risk factors, Coumadin for secondary stroke prevention. He denies any further vertigo since 10/04/23, but continues to have right-sided ear pain. He will be referred to ENT for evaluation. He was given information for the Epley maneuver to perform if vertigo recurs. Follow-up as needed, call for any changes.    Thank you for allowing me to participate in the care of this patient. Please do not hesitate to call for any questions or concerns.   Patrcia Dolly, M.D.  CC: Arthor Captain, PA-C, Dr. Versie Starks

## 2023-11-16 NOTE — Patient Instructions (Addendum)
 Good to meet you!  Referral will be sent to ENT for the right ear pain and positional vertigo  [address] [place] [address] [phone]  2. For the history of stroke, continue control of blood pressure, cholesterol, glucose levels, continue Coumadin  3. Follow-up as needed, call for any changes

## 2023-11-22 ENCOUNTER — Ambulatory Visit (HOSPITAL_COMMUNITY)
Admission: EM | Admit: 2023-11-22 | Discharge: 2023-11-22 | Disposition: A | Discharge status: Home / Self Care | Attending: Internal Medicine | Admitting: Internal Medicine

## 2023-11-22 ENCOUNTER — Encounter (HOSPITAL_COMMUNITY): Payer: Self-pay

## 2023-11-22 DIAGNOSIS — Z7901 Long term (current) use of anticoagulants: Secondary | ICD-10-CM

## 2023-11-22 DIAGNOSIS — M545 Low back pain, unspecified: Secondary | ICD-10-CM | POA: Diagnosis not present

## 2023-11-22 MED ORDER — BACLOFEN 5 MG PO TABS: 5.0000 mg | ORAL_TABLET | Freq: Three times a day (TID) | ORAL | 0 refills | Status: DC | PRN

## 2023-11-22 NOTE — ED Triage Notes (Signed)
 Pt c/o rt mid side/back pain and lt lower back pain x2wks. States pain worse when changing position. Denies injury or taking meds for pain.

## 2023-11-22 NOTE — Progress Notes (Unsigned)
 Cardiology Office Note:  .   Date:  11/23/2023  ID:  Justin Mueller, DOB [birth year], MRN [id number] PCP: Annett Fabian, MD  [facility] Providers Cardiologist:  Dietrich Pates, MD {    History of Present Illness: .   Justin Mueller is a 73 y.o. male with a past medical history of CAD, PVD, antiphospholipid antibody, HTN, HLD, type 2 diabetes mellitus who is here for overdue follow-up appointment.  Was seen for the first time by Dr. Tenny Craw follow-up 2022.  Initially from the ER.  Patient has history of CAD.  2018 a cardiac catheterization and is now status post PCI/DES x 2 to diagonal.  Moderate disease of the LAD at that time.  Back in 2022 he was at a different hospital for a pulmonary embolism (multiple segments) this occurred after his knee surgery.  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  On chronic Coumadin.  Per hematology needed Lovenox bridging in future.  Still on chronic anticoagulation and had a right lower extremity DVT by ultrasound June 2022.  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  He was seen July 2023 and noted doing well at that time.  Breathing was okay and denies chest pain.  Exercising 3 days a week.  No shortness of breath.  His diet involved oatmeal and some fresh fruit for breakfast usually skips lunch and meat and corn for dinner.  Tries to limit visits to Coumadin.  I saw the patient 06/23/2023, he tells me that he has not had any issues with his heart to his knowledge. He told me he is still exercising a few days a week. He has not had any bleeding on the coumadin. No chest pain or SOB. He continues to watch his diet.  He has a hematologist that has been initiated and in a while.  Weight has been about the same but he wants to lose some still.  Reports no shortness of breath nor dyspnea on exertion. Reports no chest pain, pressure, or tightness. No edema, orthopnea, PND. Reports no palpitations.   Today, he presents with a  history of anticoagulation for DVT/PE  with a recent episode of vertigo. He describes it as a new symptom, which was severe enough to warrant an emergency room visit. The emergency room attributed the vertigo to an issue with the right ear.He has follow-up with neurology scheduled.   The patient's INR has been consistently therapeutic on Coumadin, and he denies any bleeding complications. He also denies any cardiac symptoms such as palpitations or chest pain.  The patient is due for lipid screening at his next primary care appointment in June. He is active, exercising regularly at [employer], and has a goal of living to 73 years old. He has a history of a right quadriceps tear from a fall, which limits his ability to jog.  Reports no shortness of breath nor dyspnea on exertion. Reports no chest pain, pressure, or tightness. No edema, orthopnea, PND. Reports no palpitations.   Discussed the use of AI scribe software for clinical note transcription with the patient, who gave verbal consent to proceed.  ROS: Pertinent ROS in HPI  Studies Reviewed: .        None.      Physical Exam:   VS:  BP 130/80   Pulse 84   Ht 5\' 6"  (1.676 m)   Wt 219 lb 3.2 oz (99.4 kg)   SpO2 95%   BMI 35.38 kg/m    Wt  Readings from Last 3 Encounters:  11/23/23 219 lb 3.2 oz (99.4 kg)  11/16/23 220 lb 6.4 oz (100 kg)  10/28/23 220 lb 8 oz (100 kg)    GEN: Well nourished, well developed in no acute distress NECK: No JVD; No carotid bruits CARDIAC: RRR, no murmurs, rubs, gallops RESPIRATORY:  Clear to auscultation without rales, wheezing or rhonchi  ABDOMEN: Soft, non-tender, non-distended EXTREMITIES:  No edema; No deformity   ASSESSMENT AND PLAN: .   CAD status post PCI x 2 and diagnosed SVT -Luckily, no chest pain or shortness of breath -Continue current medication regimen including aspirin 81 mg daily, Lipitor 80 mg daily, lisinopril 10mg  daily, Coumadin as directed by the Coumadin  clinic  Anticoagulation management On Coumadin with therapeutic INR levels. No bleeding issues. - Continue regular INR monitoring. - Continue current Coumadin therapy.  Hypertension Blood pressure controlled at 130/80 mmHg. Heart rate normal. - Continue current antihypertensive regimen.  Hyperlipidemia Cholesterol labs due in June. No recent labs since last primary care visit. - Obtain cholesterol labs at next primary care appointment in June. - Send cholesterol labs to cardiology for review.  Vertigo First episode, suspected right ear origin. Awaiting neurology evaluation. Vestibular PT mentioned as potential treatment. - Attend neurology appointment for vertigo evaluation. - Consider vestibular PT referral if recommended by neurology.    Dispo: He can follow-up in 6 months with Dr. Tenny Craw  Signed, Sharlene Dory, PA-C

## 2023-11-22 NOTE — ED Provider Notes (Addendum)
 [facility]    CSN: [id number] Arrival date & time: 11/22/23  1205      History   Chief Complaint Chief Complaint  Patient presents with   Back Pain    HPI Justin Mueller is a 73 y.o. male.   Justin Mueller is a 73 y.o. male presenting for chief complaint of low back pain that started 1-2 weeks ago without recent trauma/injuries. Pain is to the lateral portions of the lower back and is triggered by movement, particularly twisting movements of the low back. Pain improves with rest and is minimal while in a stationary position. No pain to the midline spine. No fall, trauma, numbness or tingling, saddle paresthesia, changes to bowel or urinary habits, extremity weakness, radicular symptoms. Denies recent increase in exercise or heavy lifting. He is unsure if pain is better in the shower with the heat. He has not taken any OTC medications to help with symptoms PTA. Anticoagulated on warfarin, follows with PCP for INRs.    Back Pain   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)    History of pulmonary embolus (PE)    HTN (hypertension)    Low back pain 11/07/2021   Multiple subsegmental pulmonary emboli without acute cor pulmonale (HCC) 02/11/2021   T2DM (type 2 diabetes mellitus) (HCC)    Viral URI 06/23/2021    Patient Active Problem List   Diagnosis Date Noted   BPPV (benign paroxysmal positional vertigo) 10/28/2023   Erectile dysfunction 02/02/2022   Need for vaccination with 20-polyvalent pneumococcal conjugate vaccine 02/02/2022   Encounter for screening colonoscopy 02/02/2022   HLD (hyperlipidemia) 09/02/2021   PAD (peripheral artery disease) (HCC) 09/02/2021   History of DVT (deep vein thrombosis) 08/29/2021   History of pulmonary embolism 08/29/2021   Therapeutic drug monitoring 07/29/2021   Anticoagulated on Coumadin 07/29/2021    Hypertension 06/11/2021   T2DM (type 2 diabetes mellitus) (HCC) 06/11/2021   APS (antiphospholipid syndrome) (HCC) 06/11/2021   Right knee injury, sequela 06/11/2021   CAD (coronary artery disease) 06/11/2021    Past Surgical History:  Procedure Laterality Date   CHOLECYSTECTOMY  2015   HERNIA REPAIR Bilateral 2015   laparoscopic       Home Medications    Prior to Admission medications   Medication Sig Start Date End Date Taking? Authorizing Provider  Baclofen 5 MG TABS Take 1 tablet (5 mg total) by mouth every 8 (eight) hours as needed. 11/22/23  Yes Carlisle Beers, FNP  aspirin EC 81 MG tablet Take 81 mg by mouth daily. Swallow whole.    [provider]  atorvastatin (LIPITOR) 80 MG tablet Take 1 tablet (80 mg total) by mouth daily. 04/30/23   Pricilla Riffle, MD  Blood Pressure Monitoring (BLOOD PRESSURE CUFF) MISC Check blood pressure as instructed by your physician 06/23/23   Sharlene Dory, PA-C  diazepam (VALIUM) 2 MG tablet Take 1 tablet (2 mg total) by mouth every 6 (six) hours as needed (vertigo). 10/05/23   Arthor Captain, PA-C  erythromycin ophthalmic ointment Place a 1/2 inch ribbon of ointment into the lower eyelid tid x 7 days 11/12/23   Jannifer Franklin, MD  lisinopril (ZESTRIL) 10 MG tablet Take 1 tablet (10 mg total) by mouth daily. 11/08/23   Pricilla Riffle, MD  meclizine (ANTIVERT) 12.5 MG tablet Take 1-2 tablets (12.5-25 mg total) by mouth  3 (three) times daily as needed for dizziness. 10/05/23   Arthor Captain, PA-C  metFORMIN (GLUCOPHAGE) 1000 MG tablet TAKE 1 TABLET (1,000 MG TOTAL) BY MOUTH TWICE A DAY WITH FOOD 10/19/23   Annett Fabian, MD  tadalafil (CIALIS) 5 MG tablet Take 5 mg by mouth as needed for erectile dysfunction.    [provider]  testosterone cypionate (DEPOTESTOSTERONE CYPIONATE) 200 MG/ML injection Inject 200 mg into the skin 2 (two) times a week. 06/21/23   [provider]  valACYclovir (VALTREX) 500 MG tablet TAKE 1  TABLET (500 MG TOTAL) BY MOUTH 2 (TWO) TIMES DAILY. RASH ON HEAD 11/30/22   Briscoe Burns, MD  warfarin (COUMADIN) 1 MG tablet TAKE 3 TABLETS BY MOUTH DAILY OR AS DIRECTED BY COUMADIN CLINIC 08/06/23   Pricilla Riffle, MD  warfarin (COUMADIN) 5 MG tablet TAKE 1 TABLET BY MOUTH DAILY AS DIRECTED BY THE COUMADIN CLINIC OVER 30 DAYS 06/22/23   Pricilla Riffle, MD    Family History Family History  Problem Relation Age of Onset   Cervical cancer Mother    Heart disease Father    Prostate cancer Father    Diabetes Sister     Social History Social History   Tobacco Use   Smoking status: Never   Smokeless tobacco: Never  Vaping Use   Vaping status: Never Used  Substance Use Topics   Alcohol use: Yes    Comment: occasional   Drug use: Never     Allergies   Patient has no known allergies.   Review of Systems Review of Systems  Musculoskeletal:  Positive for back pain.  Per HPI   Physical Exam Triage Vital Signs ED Triage Vitals [11/22/23 1243]  Encounter Vitals Group     BP (!) 160/89     Systolic BP Percentile      Diastolic BP Percentile      Pulse Rate 67     Resp 18     Temp 98.8 F (37.1 C)     Temp Source Oral     SpO2 95 %     Weight      Height      Head Circumference      Peak Flow      Pain Score 3     Pain Loc      Pain Education      Exclude from Growth Chart    No data found.  Updated Vital Signs BP (!) 160/89 (BP Location: Left Arm)   Pulse 67   Temp 98.8 F (37.1 C) (Oral)   Resp 18   SpO2 95%   Visual Acuity Right Eye Distance:   Left Eye Distance:   Bilateral Distance:    Right Eye Near:   Left Eye Near:    Bilateral Near:     Physical Exam Vitals and nursing note reviewed.  Constitutional:      Appearance: He is not ill-appearing or toxic-appearing.  HENT:     Head: Normocephalic and atraumatic.     Right Ear: Hearing and external ear normal.     Left Ear: Hearing and external ear normal.     Nose: Nose normal.      Mouth/Throat:     Lips: Pink.  Eyes:     General: Lids are normal. Vision grossly intact. Gaze aligned appropriately.     Extraocular Movements: Extraocular movements intact.     Conjunctiva/sclera: Conjunctivae normal.  Pulmonary:     Effort: Pulmonary effort is  normal.  Musculoskeletal:     Cervical back: Normal and neck supple.     Thoracic back: Normal.     Lumbar back: Tenderness present. No swelling, edema, deformity, signs of trauma, lacerations, spasms or bony tenderness. Normal range of motion. Negative right straight leg raise test and negative left straight leg raise test. No scoliosis.       Back:     Comments: Tenderness to palpation at areas indicated in graphic above to the lateral L spine. Non-tender to the midline spinous processes/SI joints bilaterally. Strength and sensation intact to all extremities. Ambulatory with steady gait without difficulty/limp.   Skin:    General: Skin is warm and dry.     Capillary Refill: Capillary refill takes less than 2 seconds.     Findings: No rash.  Neurological:     General: No focal deficit present.     Mental Status: He is alert and oriented to person, place, and time. Mental status is at baseline.     Cranial Nerves: No dysarthria or facial asymmetry.  Psychiatric:        Mood and Affect: Mood normal.        Speech: Speech normal.        Behavior: Behavior normal.        Thought Content: Thought content normal.        Judgment: Judgment normal.      UC Treatments / Results  Labs (all labs ordered are listed, but only abnormal results are displayed) Labs Reviewed - No data to display  EKG   Radiology No results found.  Procedures Procedures (including critical care time)  Medications Ordered in UC Medications - No data to display  Initial Impression / Assessment and Plan / UC Course  I have reviewed the triage vital signs and the nursing notes.  Pertinent labs & imaging results that were available during my  care of the patient were reviewed by me and considered in my medical decision making (see chart for details).   1. Acute bilateral low back pain without sciatica Evaluation suggests pain is musculoskeletal in nature. Will manage this with rest, gentle ROM exercises, heat therapy, tylenol as needed for pain, and as needed use of muscle relaxer. Drowsiness precautions discussed regarding muscle relaxer use. Imaging: no indication for imaging based on stable musculoskeletal exam findings May follow-up with orthopedics as needed.  No drug interactions between warfarin and baclofen per up to date.  Counseled patient on potential for adverse effects with medications prescribed/recommended today, strict ER and return-to-clinic precautions discussed, patient verbalized understanding.    Final Clinical Impressions(s) / UC Diagnoses   Final diagnoses:  Acute bilateral low back pain without sciatica     Discharge Instructions      Your pain is likely due to a muscle strain which will improve on its own with time.   - You may take over the counter medicines to help with pain (tylenol 1,000mg  every 6 hours as needed).  - You may also take the prescribed muscle relaxer as directed as needed for muscle aches/spasm.  Do not take this medication and drive or drink alcohol as it can make you sleepy.  Mainly use this medicine at nighttime as needed. - Apply heat 20 minutes on then 20 minutes off and perform gentle range of motion exercises to the area of greatest pain to prevent muscle stiffness and provide further pain relief.   Red flag symptoms to watch out for are numbness/tingling to the legs,  weakness, loss of bowel/bladder control, and/or worsening pain that does not respond well to medicines. Follow-up with your primary care provider or return to urgent care if your symptoms do not improve in the next 3 to 4 days with medications and interventions recommended today. If your symptoms are severe (red  flag), please go to the emergency room.      ED Prescriptions     Medication Sig Dispense Auth. Provider   Baclofen 5 MG TABS Take 1 tablet (5 mg total) by mouth every 8 (eight) hours as needed. 20 tablet Carlisle Beers, FNP      PDMP not reviewed this encounter.   Carlisle Beers, FNP 11/22/23 1319    Carlisle Beers, FNP 11/22/23 1319

## 2023-11-22 NOTE — Discharge Instructions (Signed)
 Your pain is likely due to a muscle strain which will improve on its own with time.   - You may take over the counter medicines to help with pain (tylenol 1,000mg  every 6 hours as needed) - You may also take the prescribed muscle relaxer as directed as needed for muscle aches/spasm.  Do not take this medication and drive or drink alcohol as it can make you sleepy.  Mainly use this medicine at nighttime as needed. - Apply heat 20 minutes on then 20 minutes off and perform gentle range of motion exercises to the area of greatest pain to prevent muscle stiffness and provide further pain relief.   Red flag symptoms to watch out for are numbness/tingling to the legs, weakness, loss of bowel/bladder control, and/or worsening pain that does not respond well to medicines. Follow-up with your primary care provider or return to urgent care if your symptoms do not improve in the next 3 to 4 days with medications and interventions recommended today. If your symptoms are severe (red flag), please go to the emergency room.

## 2023-11-23 ENCOUNTER — Ambulatory Visit: Attending: Physician Assistant | Admitting: Physician Assistant

## 2023-11-23 ENCOUNTER — Encounter: Payer: Self-pay | Admitting: Physician Assistant

## 2023-11-23 VITALS — BP 130/80 | HR 84 | Ht 66.0 in | Wt 219.2 lb

## 2023-11-23 DIAGNOSIS — I739 Peripheral vascular disease, unspecified: Secondary | ICD-10-CM | POA: Insufficient documentation

## 2023-11-23 DIAGNOSIS — I471 Supraventricular tachycardia, unspecified: Secondary | ICD-10-CM | POA: Insufficient documentation

## 2023-11-23 DIAGNOSIS — Z86718 Personal history of other venous thrombosis and embolism: Secondary | ICD-10-CM | POA: Insufficient documentation

## 2023-11-23 DIAGNOSIS — E785 Hyperlipidemia, unspecified: Secondary | ICD-10-CM | POA: Diagnosis present

## 2023-11-23 DIAGNOSIS — I10 Essential (primary) hypertension: Secondary | ICD-10-CM | POA: Diagnosis present

## 2023-11-23 DIAGNOSIS — Z79899 Other long term (current) drug therapy: Secondary | ICD-10-CM | POA: Insufficient documentation

## 2023-11-23 DIAGNOSIS — I2510 Atherosclerotic heart disease of native coronary artery without angina pectoris: Secondary | ICD-10-CM | POA: Insufficient documentation

## 2023-11-23 MED ORDER — LISINOPRIL 10 MG PO TABS: 10.0000 mg | ORAL_TABLET | Freq: Every day | ORAL | 3 refills | Status: AC

## 2023-11-23 MED ORDER — ATORVASTATIN CALCIUM 80 MG PO TABS: 80.0000 mg | ORAL_TABLET | Freq: Every day | ORAL | 3 refills | Status: AC

## 2023-11-23 NOTE — Patient Instructions (Signed)
 Medication Instructions:  Your physician recommends that you continue on your current medications as directed. Please refer to the Current Medication list given to you today.  *If you need a refill on your cardiac medications before your next appointment, please call your pharmacy*   Lab Work: Check lipids at next primary care appointment If you have labs (blood work) drawn today and your tests are completely normal, you will receive your results only by: MyChart Message (if you have MyChart) OR A paper copy in the mail If you have any lab test that is abnormal or we need to change your treatment, we will call you to review the results.   Testing/Procedures: NONE   Follow-Up: At [facility], you and your health needs are our priority.  As part of our continuing mission to provide you with exceptional heart care, we have created designated Provider Care Teams.  These Care Teams include your primary Cardiologist (physician) and Advanced Practice Providers (APPs -  Physician Assistants and Nurse Practitioners) who all work together to provide you with the care you need, when you need it.  We recommend signing up for the patient portal called "MyChart".  Sign up information is provided on this After Visit Summary.  MyChart is used to connect with patients for Virtual Visits (Telemedicine).  Patients are able to view lab/test results, encounter notes, upcoming appointments, etc.  Non-urgent messages can be sent to your provider as well.   To learn more about what you can do with MyChart, go to [email or web address].    Your next appointment:   6 month(s)  Provider:   Tenny Craw, MD  Other Instructions   1st Floor: - Lobby - Registration  - Pharmacy  - Lab - Cafe  2nd Floor: - PV Lab - Diagnostic Testing (echo, CT, nuclear med)  3rd Floor: - Vacant  4th Floor: - TCTS (cardiothoracic surgery) - AFib Clinic - Structural Heart Clinic - Vascular Surgery  - Vascular  Ultrasound  5th Floor: - [facility] Cardiology (general and EP) - Clinical Pharmacy for coumadin, hypertension, lipid, weight-loss medications, and med management appointments    Valet parking services will be available as well.

## 2023-12-01 ENCOUNTER — Telehealth: Payer: Self-pay | Admitting: *Deleted

## 2023-12-01 NOTE — Telephone Encounter (Signed)
 Called pt since he is due to go the lab for INR check. He states he will go today. Will await.

## 2023-12-02 ENCOUNTER — Ambulatory Visit (INDEPENDENT_AMBULATORY_CARE_PROVIDER_SITE_OTHER)

## 2023-12-02 DIAGNOSIS — Z5181 Encounter for therapeutic drug level monitoring: Secondary | ICD-10-CM | POA: Diagnosis not present

## 2023-12-02 DIAGNOSIS — Z86718 Personal history of other venous thrombosis and embolism: Secondary | ICD-10-CM

## 2023-12-02 LAB — PROTIME-INR
INR: 4.3 — ABNORMAL HIGH (ref 0.9–1.2)
Prothrombin Time: 43 s — ABNORMAL HIGH (ref 9.1–12.0)

## 2023-12-02 NOTE — Patient Instructions (Signed)
 Description   Spoke with pt and instructed him to eat a serving of greens and HOLD today's dose and then resume taking warfarin 8mg  daily.  Recheck INR in 4 weeks.  Coumadin Clinic [phone]

## 2023-12-03 NOTE — Addendum Note (Signed)
 Addended by: Betsy Coder B on: 12/03/2023 09:37 AM   Modules accepted: Orders

## 2023-12-15 ENCOUNTER — Other Ambulatory Visit: Payer: Self-pay | Admitting: Internal Medicine

## 2023-12-15 DIAGNOSIS — Z86718 Personal history of other venous thrombosis and embolism: Secondary | ICD-10-CM

## 2023-12-15 DIAGNOSIS — Z7901 Long term (current) use of anticoagulants: Secondary | ICD-10-CM

## 2023-12-23 ENCOUNTER — Ambulatory Visit (INDEPENDENT_AMBULATORY_CARE_PROVIDER_SITE_OTHER): Admitting: Otolaryngology

## 2023-12-23 ENCOUNTER — Encounter (INDEPENDENT_AMBULATORY_CARE_PROVIDER_SITE_OTHER): Payer: Self-pay

## 2023-12-23 ENCOUNTER — Ambulatory Visit (INDEPENDENT_AMBULATORY_CARE_PROVIDER_SITE_OTHER): Admitting: Audiology

## 2023-12-23 ENCOUNTER — Institutional Professional Consult (permissible substitution) (INDEPENDENT_AMBULATORY_CARE_PROVIDER_SITE_OTHER)

## 2023-12-23 VITALS — BP 125/80 | HR 63 | Ht 66.0 in | Wt 220.0 lb

## 2023-12-23 DIAGNOSIS — Z0110 Encounter for examination of ears and hearing without abnormal findings: Secondary | ICD-10-CM | POA: Diagnosis not present

## 2023-12-23 DIAGNOSIS — R42 Dizziness and giddiness: Secondary | ICD-10-CM

## 2023-12-23 NOTE — Progress Notes (Signed)
 Dear Dr. Ty Gales, Here is my assessment for our mutual patient, Justin Mueller. Thank you for allowing me the opportunity to care for your patient. Please do not hesitate to contact me should you have any other questions. Sincerely, Dr. Milon Aloe  Otolaryngology Clinic Note Referring provider: Dr. Ty Gales HPI:  [name] is a 73 y.o. male kindly referred by Dr. Ty Gales for evaluation of vertigo.  Initial visit (11/2023): Patient reports: he had sudden onset vertigo in Feb 2025, got up and had frank vertigo. He also had nausea and vomiting, lasted for about 24-36 hours, went to ED where he got an MRI and meclizine . He slowly got better over 4-5 days and took meclizine  those days, and then has not had it since. No antecedent event, no URI or trauma to head or ears. During the episode, had slight headache, but no pain, hearing loss, fullness, drainage, light or sound sensitivity, no weakness, numbness, or other symptoms. Feels back to normal at this point, except slight soreness intermittently mastoid tip area.  Patient denies: ear pain, fullness, vertigo, drainage, tinnitus Patient additionally denies: deep pain in ear canal, eustachian tube symptoms such as popping/crackling, sensitive to pressure changes Patient also denies barotrauma, current vestibular suppressant use, ototoxic medication use Prior ear surgery: no Ears no issues generally.   H&N Surgery: no Personal or FHx of bleeding dz or anesthesia difficulty: no  AP/AC: Warfarin  Tobacco: no , PMHx: T2DM, HTN, Antiphospholipid syndrome, DVT/PE, HTN  Independent Review of Additional Tests or Records:  Dr. Duncan Gibson (10/28/2023): noted vertigo seen in ED; negative workup including MRI brain. Improved with meclizine ; f/u with Neuro ED visit (10/04/2023) Dr. Alison Irvine: dizziness acute onset; vomiting x3; worse when turns to right; no tinnitus, no other sx; Dx: Vertigo Rx: MRI done, f/u outpatient Dr. Ty Gales (11/16/2023) Neuro: on 10/04/2023  woke up feeling dizzy, nauseated and vomited x3; went to ED; MRI brain was done; given meclizine  and 80% better, back to normal; no vertigo since then, no HL or tinnitus; no head injuries; ref to ENT WBC (10/04/2023): WBC 5.8, Plt 15.9, Plt 217; BUN/Cr 15/1.21 MRI Brain 10/05/2023 independently interpreted: no mastoid effusion; no dedicated IAC cuts but no retrocochlear lesions appreciated 11/2023 Audiogram was independently reviewed and interpreted by me and it reveals - normal hearing AU, WRT 100% at AD, 96% AS at 50dB; A/A tymps   SNHL= Sensorineural hearing loss   PMH/Meds/All/SocHx/FamHx/ROS:   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)    History of pulmonary embolus (PE)    HTN (hypertension)    Low back pain 11/07/2021   Multiple subsegmental pulmonary emboli without acute cor pulmonale (HCC) 02/11/2021   T2DM (type 2 diabetes mellitus) (HCC)    Viral URI 06/23/2021     Past Surgical History:  Procedure Laterality Date   CHOLECYSTECTOMY  2015   HERNIA REPAIR Bilateral 2015   laparoscopic    Family History  Problem Relation Age of Onset   Cervical cancer Mother    Heart disease Father    Prostate cancer Father    Diabetes Sister      Social Connections: Moderately Integrated (02/04/2023)   Social Connection and Isolation Panel [NHANES]    Frequency of Communication with Friends and Family: Once a week    Frequency of Social Gatherings with Friends and Family: More than three times a week    Attends Religious  Services: 1 to 4 times per year    Active Member of Clubs or Organizations: Yes    Attends Banker Meetings: 1 to 4 times per year    Marital Status: Never married      Current Outpatient Medications:    aspirin EC 81 MG tablet, Take 81 mg by mouth daily. Swallow whole., Disp: , Rfl:    atorvastatin  (LIPITOR) 80 MG tablet, Take  1 tablet (80 mg total) by mouth daily., Disp: 90 tablet, Rfl: 3   Baclofen  5 MG TABS, Take 1 tablet (5 mg total) by mouth every 8 (eight) hours as needed., Disp: 20 tablet, Rfl: 0   Blood Pressure Monitoring (BLOOD PRESSURE CUFF) MISC, Check blood pressure as instructed by your physician, Disp: 1 each, Rfl: 0   diazepam  (VALIUM ) 2 MG tablet, Take 1 tablet (2 mg total) by mouth every 6 (six) hours as needed (vertigo)., Disp: 12 tablet, Rfl: 0   erythromycin  ophthalmic ointment, Place a 1/2 inch ribbon of ointment into the lower eyelid tid x 7 days, Disp: 3.5 g, Rfl: 0   lisinopril  (ZESTRIL ) 10 MG tablet, Take 1 tablet (10 mg total) by mouth daily., Disp: 90 tablet, Rfl: 3   meclizine  (ANTIVERT ) 12.5 MG tablet, Take 1-2 tablets (12.5-25 mg total) by mouth 3 (three) times daily as needed for dizziness., Disp: 30 tablet, Rfl: 0   metFORMIN  (GLUCOPHAGE ) 1000 MG tablet, TAKE 1 TABLET (1,000 MG TOTAL) BY MOUTH TWICE A DAY WITH FOOD, Disp: 180 tablet, Rfl: 1   tadalafil (CIALIS) 5 MG tablet, Take 5 mg by mouth as needed for erectile dysfunction., Disp: , Rfl:    testosterone cypionate (DEPOTESTOSTERONE CYPIONATE) 200 MG/ML injection, Inject 200 mg into the skin 2 (two) times a week., Disp: , Rfl:    valACYclovir  (VALTREX ) 500 MG tablet, TAKE 1 TABLET (500 MG TOTAL) BY MOUTH 2 (TWO) TIMES DAILY. RASH ON HEAD, Disp: 180 tablet, Rfl: 1   warfarin (COUMADIN ) 1 MG tablet, TAKE 3 TABLETS BY MOUTH DAILY OR AS DIRECTED BY COUMADIN  CLINIC, Disp: 270 tablet, Rfl: 1   warfarin (COUMADIN ) 5 MG tablet, TAKE 1 TABLET BY MOUTH DAILY AS DIRECTED BY THE COUMADIN  CLINIC OVER 30 DAYS, Disp: 90 tablet, Rfl: 2   Physical Exam:   BP 125/80 (BP Location: Left Arm, Patient Position: Sitting, Cuff Size: Large)   Pulse 63   Ht 5\' 6"  (1.676 m)   Wt 220 lb (99.8 kg)   SpO2 95%   BMI 35.51 kg/m   Salient findings:  CN II-XII intact Given history and complaints, ear microscopy was indicated and performed for evaluation with  findings as below in physical exam section and in procedures; Bilateral EAC clear and TM intact with well pneumatized middle ear spaces Head shake neg, DH neg, gait not broad based Anterior rhinoscopy: Septum relatively midline; bilateral inferior turbinates without significant hypertrophy No lesions of oral cavity/oropharynx No obviously palpable neck masses/lymphadenopathy/thyromegaly No respiratory distress or stridor  Seprately Identifiable Procedures:  Prior to initiating any procedures, risks/benefits/alternatives were explained to the patient and verbal consent obtained. Procedure: Bilateral ear microscopy using microscope (CPT [id number]) Pre-procedure diagnosis: vertigo Post-procedure diagnosis: same Indication: given patient's otologic complaints and history, for improved and comprehensive examination of external ear and tympanic membrane, bilateral otologic examination using microscope was performed  Procedure: Patient was placed semi-recumbent. Both ear canals were examined using the microscope with findings above.  Patient tolerated the procedure well.   Impression & Plans:  Kongmeng Santoro is a  73 y.o. male with:   1. Vertigo    DDX is wide, but given his symptoms, suspect vestibular neuritis. Audio reassuring. We discussed options including observation, vestibular rehab and vestibular eval. He'd like to observe. F/u should symptoms recur. Use meclizine  PRN. Return precautions discussed  See below regarding exact medications prescribed this encounter including dosages and route: No orders of the defined types were placed in this encounter.     Thank you for allowing me the opportunity to care for your patient. Please do not hesitate to contact me should you have any other questions.  Sincerely, Milon Aloe, MD Otolaryngologist (ENT), [facility] Phone: [phone] Fax: [phone]  12/23/2023, 10:55 AM   I have personally spent 48 minutes involved  in face-to-face and non-face-to-face activities for this patient on the day of the visit.  Professional time spent excludes any procedures performed but includes the following activities, in addition to those noted in the documentation: preparing to see the patient (review of outside documentation and results), performing a medically appropriate examination, counseling, , documenting in the electronic health record, independent result interpretation (audio, MRI)

## 2023-12-23 NOTE — Progress Notes (Signed)
  [address], [address] [place], [place] [address] [phone]  Audiological Evaluation    Name: Justin Mueller     DOB:   1951-06-14      MRN:   [id number]                                                                                     Service Date: 12/23/2023     Accompanied by: unaccompanied   Patient comes today after Dr. Lydia Sams, ENT sent a referral for a hearing evaluation due to concerns with dizziness.   Symptoms Yes Details  Hearing loss  []    Tinnitus  []    Ear pain/ infections/pressure  [x]  Maybe some right sided soreness  Balance problems  [x]  About 2 months ago went to the hospital because he was spinning. First time it has happened to him. He was prescribed with medications at the hospital. He perceived the spinning to be constant for a day. Also reports he had a headache.  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 Left ear:  Clear external ear canals and notable landmarks visualized on the tympanic membrane.  Tympanometry: Right ear: Type A- Normal external ear canal volume with normal middle ear pressure and tympanic membrane compliance Left ear: Type A- Normal external ear canal volume with normal middle ear pressure and tympanic membrane compliance    Pure tone Audiometry: Normal to borderline normal hearing from [phone] Hz in both ears.  Speech Audiometry: Right ear- Speech Reception Threshold (SRT) was obtained at 10 dBHL. Left ear-Speech Reception Threshold (SRT) was obtained at 10 dBHL.   Word Recognition Score Tested using NU-6 (MLV) Right ear: 100% was obtained at a presentation level of 50 dBHL with contralateral masking which is deemed as  excellent. Left ear: 96% was obtained at a presentation level of  50 dBHL with contralateral masking which is deemed as  excellent.   The hearing test results were completed under headphones and results are deemed to be of good reliability. Test technique:  conventional      Recommendations: Follow up with ENT as scheduled for today. Return for a hearing evaluation if concerns with hearing changes arise or per MD recommendation.   Berlie Persky MARIE LEROUX-MARTINEZ, AUD

## 2023-12-24 LAB — HM DIABETES EYE EXAM

## 2023-12-28 ENCOUNTER — Telehealth: Payer: Self-pay | Admitting: *Deleted

## 2023-12-28 NOTE — Telephone Encounter (Signed)
 Called pt since INR is due. He states he will go later today. Will follow up.

## 2023-12-29 ENCOUNTER — Ambulatory Visit (INDEPENDENT_AMBULATORY_CARE_PROVIDER_SITE_OTHER): Payer: Self-pay

## 2023-12-29 DIAGNOSIS — Z5181 Encounter for therapeutic drug level monitoring: Secondary | ICD-10-CM

## 2023-12-29 LAB — PROTIME-INR
INR: 2.7 — ABNORMAL HIGH (ref 0.9–1.2)
Prothrombin Time: 28.1 s — ABNORMAL HIGH (ref 9.1–12.0)

## 2023-12-29 NOTE — Patient Instructions (Signed)
 Description   Spoke with pt and instructed him to continue taking warfarin 8mg  daily.  Recheck INR in 5 weeks.  Coumadin  Clinic [phone]

## 2023-12-31 ENCOUNTER — Encounter: Payer: Self-pay | Admitting: Audiology

## 2024-02-01 ENCOUNTER — Telehealth: Payer: Self-pay | Admitting: *Deleted

## 2024-02-01 ENCOUNTER — Other Ambulatory Visit: Payer: Self-pay | Admitting: *Deleted

## 2024-02-01 DIAGNOSIS — Z86711 Personal history of pulmonary embolism: Secondary | ICD-10-CM

## 2024-02-01 DIAGNOSIS — Z7901 Long term (current) use of anticoagulants: Secondary | ICD-10-CM

## 2024-02-01 DIAGNOSIS — Z86718 Personal history of other venous thrombosis and embolism: Secondary | ICD-10-CM

## 2024-02-01 NOTE — Telephone Encounter (Signed)
 Called and spoke with Justin Mueller concerning lab order this morning for PT/INR. I did make him aware a lab order had been ordered and I apologized for the confusion and incontinence. I did confirm a lab order for PT/INR has been ordered and released. Pt stated he will go around 9:00am tomorrow morning. I provided pt with direct number to coumadin  clinic if he has any more issues while at the lab tomorrow, he can call while he is at [facility] and we can correct the issue. Pt verbalized understanding and was very appreciative for the help.

## 2024-02-01 NOTE — Telephone Encounter (Signed)
 Patient is calling stating he went to [facility] today and they did not have the orders. He reports he waited over an hour, but they were not received. He is requesting they be sent, so he can have the draw done tomorrow morning. Please advise.

## 2024-02-01 NOTE — Telephone Encounter (Signed)
 Called pt since INR is due, he states he will go in just a bit. Advised will follow up later today or tomorrow when resulted.

## 2024-02-03 ENCOUNTER — Other Ambulatory Visit: Payer: Self-pay

## 2024-02-03 ENCOUNTER — Encounter: Payer: Self-pay | Admitting: *Deleted

## 2024-02-03 ENCOUNTER — Ambulatory Visit (INDEPENDENT_AMBULATORY_CARE_PROVIDER_SITE_OTHER): Payer: Self-pay

## 2024-02-03 ENCOUNTER — Ambulatory Visit: Payer: Self-pay | Admitting: Internal Medicine

## 2024-02-03 DIAGNOSIS — Z5181 Encounter for therapeutic drug level monitoring: Secondary | ICD-10-CM

## 2024-02-03 DIAGNOSIS — Z86711 Personal history of pulmonary embolism: Secondary | ICD-10-CM

## 2024-02-03 DIAGNOSIS — Z86718 Personal history of other venous thrombosis and embolism: Secondary | ICD-10-CM

## 2024-02-03 DIAGNOSIS — Z7901 Long term (current) use of anticoagulants: Secondary | ICD-10-CM

## 2024-02-03 LAB — PROTIME-INR
INR: 2.6 — ABNORMAL HIGH (ref 0.9–1.2)
Prothrombin Time: 26.9 s — ABNORMAL HIGH (ref 9.1–12.0)

## 2024-02-03 NOTE — Patient Instructions (Signed)
Description   Spoke with pt and instructed him to continue taking warfarin 8mg  daily.  Recheck INR in 6 weeks.  Coumadin Clinic [phone]

## 2024-02-10 ENCOUNTER — Encounter: Payer: Self-pay | Admitting: Family Medicine

## 2024-02-10 ENCOUNTER — Telehealth: Admitting: Family Medicine

## 2024-02-10 DIAGNOSIS — R0683 Snoring: Secondary | ICD-10-CM

## 2024-02-10 NOTE — Progress Notes (Signed)
    Needs referral to get back on CPAP- advised to call PCP office for this.  Patient acknowledged agreement and understanding of the plan.

## 2024-02-11 ENCOUNTER — Other Ambulatory Visit: Payer: Self-pay | Admitting: Student

## 2024-02-14 ENCOUNTER — Emergency Department (HOSPITAL_COMMUNITY)
Admission: EM | Admit: 2024-02-14 | Discharge: 2024-02-14 | Discharge status: Against Medical Advice | Attending: Emergency Medicine | Admitting: Emergency Medicine

## 2024-02-14 ENCOUNTER — Other Ambulatory Visit: Payer: Self-pay

## 2024-02-14 ENCOUNTER — Encounter (HOSPITAL_COMMUNITY): Payer: Self-pay

## 2024-02-14 DIAGNOSIS — Z5321 Procedure and treatment not carried out due to patient leaving prior to being seen by health care provider: Secondary | ICD-10-CM | POA: Insufficient documentation

## 2024-02-14 DIAGNOSIS — R42 Dizziness and giddiness: Secondary | ICD-10-CM | POA: Diagnosis present

## 2024-02-14 DIAGNOSIS — R519 Headache, unspecified: Secondary | ICD-10-CM | POA: Diagnosis not present

## 2024-02-14 LAB — CBC WITH DIFFERENTIAL/PLATELET
Abs Immature Granulocytes: 0.01 10*3/uL (ref 0.00–0.07)
Basophils Absolute: 0 10*3/uL (ref 0.0–0.1)
Basophils Relative: 0 %
Eosinophils Absolute: 0 10*3/uL (ref 0.0–0.5)
Eosinophils Relative: 0 %
HCT: 44.5 % (ref 39.0–52.0)
Hemoglobin: 14.6 g/dL (ref 13.0–17.0)
Immature Granulocytes: 0 %
Lymphocytes Relative: 24 %
Lymphs Abs: 1.1 10*3/uL (ref 0.7–4.0)
MCH: 30.6 pg (ref 26.0–34.0)
MCHC: 32.8 g/dL (ref 30.0–36.0)
MCV: 93.3 fL (ref 80.0–100.0)
Monocytes Absolute: 0.4 10*3/uL (ref 0.1–1.0)
Monocytes Relative: 9 %
Neutro Abs: 3.1 10*3/uL (ref 1.7–7.7)
Neutrophils Relative %: 67 %
Platelets: 219 10*3/uL (ref 150–400)
RBC: 4.77 MIL/uL (ref 4.22–5.81)
RDW: 14.4 % (ref 11.5–15.5)
WBC: 4.7 10*3/uL (ref 4.0–10.5)
nRBC: 0 % (ref 0.0–0.2)

## 2024-02-14 LAB — URINALYSIS, ROUTINE W REFLEX MICROSCOPIC
Bacteria, UA: NONE SEEN
Bilirubin Urine: NEGATIVE
Glucose, UA: NEGATIVE mg/dL
Hgb urine dipstick: NEGATIVE
Ketones, ur: NEGATIVE mg/dL
Leukocytes,Ua: NEGATIVE
Nitrite: NEGATIVE
Protein, ur: 100 mg/dL — AB
Specific Gravity, Urine: 1.018 (ref 1.005–1.030)
pH: 5 (ref 5.0–8.0)

## 2024-02-14 LAB — BASIC METABOLIC PANEL WITH GFR
Anion gap: 9 (ref 5–15)
BUN: 13 mg/dL (ref 8–23)
CO2: 25 mmol/L (ref 22–32)
Calcium: 8.5 mg/dL — ABNORMAL LOW (ref 8.9–10.3)
Chloride: 108 mmol/L (ref 98–111)
Creatinine, Ser: 1.15 mg/dL (ref 0.61–1.24)
GFR, Estimated: >60 mL/min (ref >60)
Glucose, Bld: 164 mg/dL — ABNORMAL HIGH (ref 70–99)
Potassium: 4.5 mmol/L (ref 3.5–5.1)
Sodium: 142 mmol/L (ref 135–145)

## 2024-02-14 NOTE — ED Provider Triage Note (Signed)
 Emergency Medicine Provider Triage Evaluation Note  Justin Mueller , a 73 y.o. male  was evaluated in triage.  Pt complains of dizziness and headache.  History of vertigo, feels same.  Review of Systems  Positive: Intermittent dizziness, headache Negative: Chest pain, numbness, weakness, vision changes, tinnitus, injury, SHOB  Physical Exam  BP (!) 155/99   Pulse 66   Temp 98.6 F (37 C)   Resp 18   Ht 5' 6 (1.676 m)   Wt 95.3 kg   SpO2 96%   BMI 33.89 kg/m  Gen:   Awake, no distress   Resp:  Normal effort  MSK:   Moves extremities without difficulty  Other:    Medical Decision Making  Medically screening exam initiated at 2:44 PM.  Appropriate orders placed.  Justin Mueller was informed that the remainder of the evaluation will be completed by another provider, this initial triage assessment does not replace that evaluation, and the importance of remaining in the ED until their evaluation is complete.  Labs ordered   Justin Charity, PA-C 02/14/24 1445

## 2024-02-14 NOTE — ED Notes (Signed)
 Pt. Said they would come in the morning.

## 2024-02-14 NOTE — ED Triage Notes (Addendum)
 Pt to ED c/o dizzy spell yesterday morning , and another episode this morning around 5am. Pt reports hx of vertigo. No syncopal episode. Reports HA. Reports this episode feels like previous vertigo episode.

## 2024-02-15 ENCOUNTER — Emergency Department (HOSPITAL_COMMUNITY)
Admission: EM | Admit: 2024-02-15 | Discharge: 2024-02-15 | Disposition: A | Discharge status: Home / Self Care | Attending: Emergency Medicine | Admitting: Emergency Medicine

## 2024-02-15 ENCOUNTER — Other Ambulatory Visit: Payer: Self-pay

## 2024-02-15 ENCOUNTER — Encounter (HOSPITAL_COMMUNITY): Payer: Self-pay

## 2024-02-15 DIAGNOSIS — Z7901 Long term (current) use of anticoagulants: Secondary | ICD-10-CM | POA: Insufficient documentation

## 2024-02-15 DIAGNOSIS — R42 Dizziness and giddiness: Secondary | ICD-10-CM | POA: Insufficient documentation

## 2024-02-15 DIAGNOSIS — Z7982 Long term (current) use of aspirin: Secondary | ICD-10-CM | POA: Diagnosis not present

## 2024-02-15 MED ORDER — MECLIZINE HCL 12.5 MG PO TABS: 12.5000 mg | ORAL_TABLET | Freq: Three times a day (TID) | ORAL | 0 refills | Status: DC | PRN

## 2024-02-15 NOTE — ED Triage Notes (Signed)
 Pt c/o pain behind right ear, has had dizziness that started yesterday. Pt seen for same yesterday, left prior to getting results. Pt states soreness behind ear is there, feels like dizziness has improved.

## 2024-02-15 NOTE — ED Provider Notes (Signed)
 [facility] EMERGENCY DEPARTMENT AT [facility] Provider Note   CSN: [id number] Arrival date & time: 02/15/24  0845     Patient presents with: Dizziness   Justin Mueller is a 73 y.o. male.   HPI Patient presents after leaving yesterday due to delay in obtaining results.  He notes that yesterday developed dizziness, as documented in prior documentation.  He has begun to feel better overnight, with substantial reduction in his dizziness, no new weakness, vomiting, nor any other new complaints. He notes that symptoms are similar to those experienced during prior episode of vertigo.  Today he returns for results discussion, consideration of therapy, and instructions.    Prior to Admission medications   Medication Sig Start Date End Date Taking? Authorizing Provider  aspirin EC 81 MG tablet Take 81 mg by mouth daily. Swallow whole.    [provider]  atorvastatin  (LIPITOR) 80 MG tablet Take 1 tablet (80 mg total) by mouth daily. 11/23/23   Von Grumbling, PA-C  Baclofen  5 MG TABS Take 1 tablet (5 mg total) by mouth every 8 (eight) hours as needed. 11/22/23   Starlene Eaton, FNP  Blood Pressure Monitoring (BLOOD PRESSURE CUFF) MISC Check blood pressure as instructed by your physician 06/23/23   Von Grumbling, PA-C  diazepam  (VALIUM ) 2 MG tablet Take 1 tablet (2 mg total) by mouth every 6 (six) hours as needed (vertigo). 10/05/23   Harris, Abigail, PA-C  erythromycin  ophthalmic ointment Place a 1/2 inch ribbon of ointment into the lower eyelid tid x 7 days 11/12/23   Lesle Ras, MD  lisinopril  (ZESTRIL ) 10 MG tablet Take 1 tablet (10 mg total) by mouth daily. 11/23/23   Conte, Tessa N, PA-C  meclizine  (ANTIVERT ) 12.5 MG tablet Take 1-2 tablets (12.5-25 mg total) by mouth 3 (three) times daily as needed for dizziness. 02/15/24   Dorenda Gandy, MD  metFORMIN  (GLUCOPHAGE ) 1000 MG tablet TAKE 1 TABLET (1,000 MG TOTAL) BY MOUTH TWICE A DAY WITH FOOD 10/19/23   Dorthy Gavia, MD  tadalafil (CIALIS) 5 MG tablet Take 5 mg by mouth as needed for erectile dysfunction.    [provider]  testosterone cypionate (DEPOTESTOSTERONE CYPIONATE) 200 MG/ML injection Inject 200 mg into the skin 2 (two) times a week. 06/21/23   [provider]  valACYclovir  (VALTREX ) 500 MG tablet TAKE 1 TABLET (500 MG TOTAL) BY MOUTH 2 (TWO) TIMES DAILY. RASH ON HEAD 11/30/22   Jinwala, Sagar H, MD  warfarin (COUMADIN ) 1 MG tablet TAKE 3 TABLETS BY MOUTH DAILY OR AS DIRECTED BY COUMADIN  CLINIC 12/15/23   Ross, Paula V, MD  warfarin (COUMADIN ) 5 MG tablet TAKE 1 TABLET BY MOUTH DAILY AS DIRECTED BY THE COUMADIN  CLINIC OVER 30 DAYS 06/22/23   Elmyra Haggard, MD    Allergies: Patient has no known allergies.    Review of Systems  Updated Vital Signs BP (!) 140/93 (BP Location: Right Arm)   Pulse 88   Temp 98.9 F (37.2 C)   Resp 20   Ht 5' 6 (1.676 m)   Wt 97.5 kg   SpO2 98%   BMI 34.70 kg/m   Physical Exam Vitals and nursing note reviewed.  Constitutional:      General: He is not in acute distress.    Appearance: He is well-developed.  HENT:     Head: Normocephalic and atraumatic.     Right Ear: Tympanic membrane normal.   Eyes:     Conjunctiva/sclera: Conjunctivae normal.  Cardiovascular:     Rate and Rhythm: Normal rate and regular rhythm.  Pulmonary:     Effort: Pulmonary effort is normal. No respiratory distress.     Breath sounds: No stridor.  Abdominal:     General: There is no distension.   Skin:    General: Skin is warm and dry.   Neurological:     Mental Status: He is alert and oriented to person, place, and time.     Cranial Nerves: No cranial nerve deficit.     Motor: No weakness.     Coordination: Coordination normal.     Comments: Whereas yesterday the patient cannot move his head without provoking dizziness today he notes range of motion is essentially normal without provoke dizziness except for mildly.    (all labs ordered  are listed, but only abnormal results are displayed) Labs Reviewed - No data to display  EKG: None  Radiology: No results found.   Procedures   Medications Ordered in the ED - No data to display                                  Medical Decision Making Adult male prior episodes of dizziness presents with ongoing dizziness, though improved since yesterday given his prior history, improvement, suspicion for vertigo, stroke is a consideration, though without other focal neurodeficits is less likely. Patient's ECG reviewed, no ischemic changes that are obvious, no arrhythmia, less likely cardiogenic dizziness. Physical exam reassuring, improvement reassuring, patient started on appropriate meds, will follow-up with ENT and primary care.  Amount and/or Complexity of Data Reviewed External Data Reviewed: notes.    Details: Yesterday's notes reviewed Labs:  Decision-making details documented in ED Course.    Details: Notes from yesterday including labs reviewed discussed ECG/medicine tests: independent interpretation performed. Decision-making details documented in ED Course.  Risk Prescription drug management.   Final diagnoses:  Dizziness    ED Discharge Orders          Ordered    meclizine  (ANTIVERT ) 12.5 MG tablet  3 times daily PRN        02/15/24 0918               Dorenda Gandy, MD 02/15/24 [phone]

## 2024-02-15 NOTE — Discharge Instructions (Signed)
 Please be sure to monitor your condition carefully and follow-up with your physician as well as with our ENT colleagues.  Return here for concerning changes in your condition.

## 2024-02-16 ENCOUNTER — Encounter: Payer: Self-pay | Admitting: Student

## 2024-02-16 ENCOUNTER — Other Ambulatory Visit: Payer: Self-pay | Admitting: Student

## 2024-02-16 ENCOUNTER — Ambulatory Visit (INDEPENDENT_AMBULATORY_CARE_PROVIDER_SITE_OTHER): Payer: Self-pay | Admitting: Student

## 2024-02-16 VITALS — BP 139/82 | HR 72 | Temp 98.3°F | Ht 66.0 in | Wt 218.8 lb

## 2024-02-16 DIAGNOSIS — R42 Dizziness and giddiness: Secondary | ICD-10-CM | POA: Diagnosis not present

## 2024-02-16 DIAGNOSIS — R0683 Snoring: Secondary | ICD-10-CM

## 2024-02-16 MED ORDER — BACLOFEN 5 MG PO TABS: 5.0000 mg | ORAL_TABLET | Freq: Three times a day (TID) | ORAL | 0 refills | Status: DC | PRN

## 2024-02-16 MED ORDER — MECLIZINE HCL 12.5 MG PO TABS: 12.5000 mg | ORAL_TABLET | Freq: Three times a day (TID) | ORAL | 0 refills | Status: DC | PRN

## 2024-02-16 NOTE — Patient Instructions (Addendum)
 Thank you, [name] for allowing us  to provide your care today. Today we discussed your snoring and vertigo.  I am glad the meclizine  has been helping you with your vertigo.  I have sent a refill to your pharmacy.  Since we do not have records of your previous sleep study from a long time ago, we will repeat it to evaluate for obstructive sleep apnea.  If this is confirmed, we will restart your CPAP.  They should call you to schedule this.  Referrals ordered today:    Referral Orders         Ambulatory referral to Sleep Studies      I have ordered the following medication/changed the following medications:   Start the following medications: Meds ordered this encounter  Medications   meclizine  (ANTIVERT ) 12.5 MG tablet    Sig: Take 1-2 tablets (12.5-25 mg total) by mouth 3 (three) times daily as needed for dizziness.    Dispense:  30 tablet    Refill:  0     Follow up: 3 months   We look forward to seeing you next time. Please call our clinic at [phone] if you have any questions or concerns. The best time to call is Monday-Friday from 9am-4pm, but there is someone available 24/7. If after hours or the weekend, call the main hospital number and ask for the Internal Medicine Resident On-Call. If you need medication refills, please notify your pharmacy one week in advance and they will send us  a request.   Thank you for trusting me with your care. Wishing you the best!   Dorthy Gavia, MD [facility]

## 2024-02-16 NOTE — Progress Notes (Signed)
 CC: med refill  HPI:  Justin Mueller is a 73 y.o. male living with a history stated below and presents today for the chief complaint stated above. Please see problem based assessment and plan for additional details.  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 Swallow whole.     atorvastatin  (LIPITOR) 80 MG tablet Take 1 tablet (80 mg total) by mouth daily. 90 tablet 3   Baclofen  5 MG TABS Take 1 tablet (5 mg total) by mouth every 8 (eight) hours as needed. 20 tablet 0   Blood Pressure Monitoring (BLOOD PRESSURE CUFF) MISC Check blood pressure as instructed by your physician 1 each 0   diazepam  (VALIUM ) 2 MG tablet Take 1 tablet (2 mg total) by mouth every 6 (six) hours as needed (vertigo). 12 tablet 0   erythromycin  ophthalmic ointment Place a 1/2 inch ribbon of ointment into the lower eyelid tid x 7 days 3.5 g 0   lisinopril  (ZESTRIL ) 10 MG tablet Take 1 tablet (10 mg total) by mouth daily. 90 tablet 3   metFORMIN  (GLUCOPHAGE ) 1000 MG tablet TAKE 1 TABLET (1,000 MG TOTAL) BY MOUTH TWICE A DAY WITH FOOD 180 tablet 1   tadalafil (CIALIS) 5 MG tablet Take 5 mg by mouth as needed for erectile dysfunction.     testosterone cypionate (DEPOTESTOSTERONE CYPIONATE) 200 MG/ML injection Inject 200 mg into the skin 2 (two) times a week.     valACYclovir  (VALTREX ) 500 MG tablet TAKE 1 TABLET (500 MG TOTAL) BY MOUTH 2 (TWO) TIMES DAILY. RASH ON HEAD 180 tablet 1   warfarin (COUMADIN ) 1 MG tablet  TAKE 3 TABLETS BY MOUTH DAILY OR AS DIRECTED BY COUMADIN  CLINIC 270 tablet 1   warfarin (COUMADIN ) 5 MG tablet TAKE 1 TABLET BY MOUTH DAILY AS DIRECTED BY THE COUMADIN  CLINIC OVER 30 DAYS 90 tablet 2   No current facility-administered medications on file prior to visit.    Family History  Problem Relation Age of Onset   Cervical cancer Mother    Heart disease Father    Prostate cancer Father    Diabetes Sister     Social History   Socioeconomic History   Marital status: Single    Spouse name: Not on file   Number of children: Not on file   Years of education: Not on file   Highest education level: Not on file  Occupational History   Occupation: Retired, formerly worked at [employer]  Tobacco Use   Smoking status: Never   Smokeless tobacco: Never  Vaping Use   Vaping status: Never Used  Substance and Sexual Activity   Alcohol use: Yes    Comment: occasional   Drug use: Never   Sexual activity: Not on file  Other Topics Concern   Not on file  Social History Narrative   Recently moved from [place] . Lives by himself, but has a cousin that lives close by.  Right handed    Retired    Teacher, early years/pre Strain: Low Risk  (02/04/2023)   Overall Financial Resource Strain (CARDIA)    Difficulty of Paying Living Expenses: Not hard at all  Food Insecurity: No Food Insecurity (02/04/2023)   Hunger Vital Sign    Worried About Running Out of Food in the Last Year: Never true    Ran Out of Food in the Last Year: Never true  Transportation Needs: No Transportation Needs (02/04/2023)   PRAPARE - Administrator, Civil Service (Medical): No    Lack of Transportation (Non-Medical): No  Physical Activity: Insufficiently Active (02/04/2023)   Exercise Vital Sign    Days of Exercise per Week: 2 days    Minutes of Exercise per Session: 70 min  Stress: No Stress Concern Present (02/04/2023)   [employer] of Occupational Health -  Occupational Stress Questionnaire    Feeling of Stress : Not at all  Social Connections: Moderately Integrated (02/04/2023)   Social Connection and Isolation Panel    Frequency of Communication with Friends and Family: Once a week    Frequency of Social Gatherings with Friends and Family: More than three times a week    Attends Religious Services: 1 to 4 times per year    Active Member of [employer] or Organizations: Yes    Attends Banker Meetings: 1 to 4 times per year    Marital Status: Never married  Intimate Partner Violence: Not At Risk (02/04/2023)   Humiliation, Afraid, Rape, and Kick questionnaire    Fear of Current or Ex-Partner: No    Emotionally Abused: No    Physically Abused: No    Sexually Abused: No   Review of Systems: ROS negative except for what is noted on the assessment and plan.  Vitals:   02/16/24 0933  BP: 139/82  Pulse: 72  Temp: 98.3 F (36.8 C)  TempSrc: Oral  SpO2: 96%  Weight: 218 lb 12.8 oz (99.2 kg)  Height: 5' 6 (1.676 m)   Physical Exam: Well-appearing man, sitting in chair no acute distress Heart regular rate and rhythm, no murmurs, euvolemic Lungs clear to auscultation bilaterally Alert and oriented, no focal neurological deficits No apparent skin changes  Assessment & Plan:   Patient discussed with Dr. Ancil Balzarine  Snoring He is requesting referral to sleep medicine for evaluation for obstructive sleep apnea.  He notes that he snores and often feels fatigued throughout the day.  He does have hypertension and elevated BMI. He previously had a sleep study over a decade ago and used to use CPAP, although I am unable to find records of this in the chart.  His STOP-BANG score is 5.  I think he would benefit from a repeat sleep study, so I placed a referral.  Vertigo He continues to have vertigo occasionally, but says it is well-managed with meclizine .  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  They offered vestibular therapy, however he preferred to just manage it conservatively at that time.  I spoke with him about this again at this visit and he would like to move forward with vestibular therapy, so I have put in the order.  I also sent a refill of his meclizine .  Dorthy Gavia, MD [facility] Internal Medicine, PGY-1 Phone: [phone] Date 02/16/2024 Time 9:54 AM

## 2024-02-16 NOTE — Assessment & Plan Note (Addendum)
 He is requesting referral to sleep medicine for evaluation for obstructive sleep apnea.  He notes that he snores and often feels fatigued throughout the day.  He does have hypertension and elevated BMI. He previously had a sleep study over a decade ago and used to use CPAP, although I am unable to find records of this in the chart.  His STOP-BANG score is 5.  I think he would benefit from a repeat sleep study, so I placed a referral.

## 2024-02-16 NOTE — Telephone Encounter (Signed)
 Copied from CRM [id number]. Topic: Clinical - Medication Refill >> Feb 16, 2024 11:27 AM Retta Caster wrote: Medication: Baclofen  5 MG TABS [[id number]]  Has the patient contacted their pharmacy? Yes (Agent: If no, request that the patient contact the pharmacy for the refill. If patient does not wish to contact the pharmacy document the reason why and proceed with request.) (Agent: If yes, when and what did the pharmacy advise?)  This is the patient's preferred pharmacy:  [id number] Jonette Nestle, [place] - [address] [place] [address] Phone: [phone] Fax: [phone]  Is this the correct pharmacy for this prescription? Yes If no, delete pharmacy and type the correct one.   Has the prescription been filled recently? Yes  Is the patient out of the medication? Yes  Has the patient been seen for an appointment in the last year OR does the patient have an upcoming appointment? Yes  Can we respond through MyChart? Yes  Agent: Please be advised that Rx refills may take up to 3 business days. We ask that you follow-up with your pharmacy.

## 2024-02-16 NOTE — Assessment & Plan Note (Addendum)
 He continues to have vertigo occasionally, but says it is well-managed with meclizine .  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  They offered vestibular therapy, however he preferred to just manage it conservatively at that time.  I spoke with him about this again at this visit and he would like to move forward with vestibular therapy, so I have put in the order.  I also sent a refill of his meclizine .

## 2024-02-17 NOTE — Addendum Note (Signed)
 Addended by: Bevelyn Bryant on: 02/17/2024 11:36 AM   Modules accepted: Level of Service

## 2024-02-17 NOTE — Progress Notes (Signed)
 Internal Medicine Clinic Attending  Case discussed with the resident at the time of the visit.  We reviewed the resident's history and exam and pertinent patient test results.  I agree with the assessment, diagnosis, and plan of care documented in the resident's note.

## 2024-03-17 ENCOUNTER — Ambulatory Visit: Payer: Self-pay | Admitting: Internal Medicine

## 2024-03-17 ENCOUNTER — Ambulatory Visit (INDEPENDENT_AMBULATORY_CARE_PROVIDER_SITE_OTHER): Admitting: Cardiovascular Disease

## 2024-03-17 DIAGNOSIS — Z7901 Long term (current) use of anticoagulants: Secondary | ICD-10-CM

## 2024-03-17 DIAGNOSIS — Z86711 Personal history of pulmonary embolism: Secondary | ICD-10-CM

## 2024-03-17 DIAGNOSIS — Z86718 Personal history of other venous thrombosis and embolism: Secondary | ICD-10-CM

## 2024-03-17 DIAGNOSIS — Z5181 Encounter for therapeutic drug level monitoring: Secondary | ICD-10-CM

## 2024-03-17 LAB — PROTIME-INR
INR: 3.2 — ABNORMAL HIGH (ref 0.9–1.2)
Prothrombin Time: 32.1 s — ABNORMAL HIGH (ref 9.1–12.0)

## 2024-03-17 NOTE — Progress Notes (Signed)
Please see anticoagulation encounter.

## 2024-03-21 ENCOUNTER — Institutional Professional Consult (permissible substitution): Admitting: Neurology

## 2024-04-03 ENCOUNTER — Ambulatory Visit (INDEPENDENT_AMBULATORY_CARE_PROVIDER_SITE_OTHER): Admitting: Neurology

## 2024-04-03 ENCOUNTER — Encounter: Payer: Self-pay | Admitting: Neurology

## 2024-04-03 VITALS — BP 132/78 | HR 79 | Ht 66.0 in | Wt 218.0 lb

## 2024-04-03 DIAGNOSIS — E669 Obesity, unspecified: Secondary | ICD-10-CM

## 2024-04-03 DIAGNOSIS — R351 Nocturia: Secondary | ICD-10-CM

## 2024-04-03 DIAGNOSIS — R0683 Snoring: Secondary | ICD-10-CM

## 2024-04-03 DIAGNOSIS — D6861 Antiphospholipid syndrome: Secondary | ICD-10-CM

## 2024-04-03 DIAGNOSIS — Z86711 Personal history of pulmonary embolism: Secondary | ICD-10-CM

## 2024-04-03 DIAGNOSIS — Z8669 Personal history of other diseases of the nervous system and sense organs: Secondary | ICD-10-CM | POA: Diagnosis not present

## 2024-04-03 DIAGNOSIS — Z86718 Personal history of other venous thrombosis and embolism: Secondary | ICD-10-CM

## 2024-04-03 DIAGNOSIS — R0681 Apnea, not elsewhere classified: Secondary | ICD-10-CM

## 2024-04-03 NOTE — Progress Notes (Signed)
 Subjective:    Patient ID: Justin Mueller is a 73 y.o. male.  HPI    True Mar, MD, PhD [facility] [address], [address] [address] [place], [place] [address]  Dear Dr. Gregary,   I saw your patient, Justin Mueller, upon your kind request in my sleep clinic today for initial consultation of his sleep disorder, in particular, evaluation of his prior diagnosis of obstructive sleep apnea.  The patient is unaccompanied today.  As you know, Justin Mueller is a 73 year old male with an underlying medical history of antiphospholipid antibody syndrome, DVT, PE, hypertension, diabetes, vertigo (for which he recently saw Dr. Georjean at [facility] neurology), low back pain, and obesity, who reports snoring and excessive daytime somnolence.  His Epworth sleepiness score is.  I reviewed your office note from 02/16/2024.  He was previously diagnosed several years ago with obstructive sleep apnea and placed on PAP therapy but he is no longer on a CPAP or similar machine. Prior sleep study results are not available for my review today.  [place] was [place] in [place] about 7 years ago per patient.  A PAP compliance data is not available for my review today.  He has not used his CPAP machine in years and no longer has a machine.  As far as he recalls, he was in the borderline severe range with his sleep apnea at the time.  He is single and and lives alone, no pets in the house.  He wakes himself up from his own snoring and has been told he has apneas.  He retired from the [employer].  He worked nights for most of the time he worked.  He is a non-smoker and drinks wine about once a week.  He does not drink caffeine daily.  He is working on weight loss but weight has been more or less stable in the past 2 to 3 years.  Bedtime is generally between 11 and 11:30 PM and rise time between 9 and 9:30 AM.  He has nocturia about once per average night, denies recurrent nocturnal or  morning headaches.  He is not aware of any family history of sleep apnea.  He had a tonsillectomy at age 63 or 43.  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)    History of pulmonary embolus (PE)    HTN (hypertension)    Low back pain 11/07/2021   Multiple subsegmental pulmonary emboli without acute cor pulmonale (HCC) 02/11/2021   T2DM (type 2 diabetes mellitus) (HCC)    Viral URI 06/23/2021    His Past Surgical History Is Significant For: Past Surgical History:  Procedure Laterality Date   CHOLECYSTECTOMY  2015   HERNIA REPAIR Bilateral 2015   laparoscopic    His Family History Is Significant For: Family History  Problem Relation Age of Onset   Cervical cancer Mother    Heart disease Father    Prostate cancer Father    Diabetes Sister    Sleep apnea Neg Hx     His Social History Is Significant For: Social History   Socioeconomic History   Marital status: Single    Spouse name: Not on file   Number of children: Not on file   Years of education: Not on file   Highest education level: Not on file  Occupational History   Occupation: Retired, formerly worked at [employer]  Tobacco Use   Smoking status: Never   Smokeless tobacco: Never  Vaping Use   Vaping status: Never Used  Substance and Sexual Activity   Alcohol use: Yes    Alcohol/week: 2.0 standard drinks of alcohol    Types: 2 Glasses of wine per week    Comment: occasional   Drug use: Never   Sexual activity: Not on file  Other Topics Concern   Not on file  Social History Narrative   Recently moved from [place] . Lives by himself, but has a cousin that lives close by.    Right handed    Retired    Teacher, early years/pre Strain: Low Risk  (02/04/2023)   Overall Financial Resource Strain (CARDIA)     Difficulty of Paying Living Expenses: Not hard at all  Food Insecurity: No Food Insecurity (02/04/2023)   Hunger Vital Sign    Worried About Running Out of Food in the Last Year: Never true    Ran Out of Food in the Last Year: Never true  Transportation Needs: No Transportation Needs (02/04/2023)   PRAPARE - Administrator, Civil Service (Medical): No    Lack of Transportation (Non-Medical): No  Physical Activity: Insufficiently Active (02/04/2023)   Exercise Vital Sign    Days of Exercise per Week: 2 days    Minutes of Exercise per Session: 70 min  Stress: No Stress Concern Present (02/04/2023)   [employer] of Occupational Health - Occupational Stress Questionnaire    Feeling of Stress : Not at all  Social Connections: Moderately Integrated (02/04/2023)   Social Connection and Isolation Panel    Frequency of Communication with Friends and Family: Once a week    Frequency of Social Gatherings with Friends and Family: More than three times a week    Attends Religious Services: 1 to 4 times per year    Active Member of [employer] or Organizations: Yes    Attends Banker Meetings: 1 to 4 times per year    Marital Status: Never married    His Allergies Are:  No Known Allergies:   His Current Medications Are:  Outpatient Encounter Medications as of 04/03/2024  Medication Sig   aspirin EC 81 MG tablet Take 81 mg by mouth daily. Swallow whole.   atorvastatin  (LIPITOR) 80 MG tablet Take 1 tablet (80 mg total) by mouth daily.   Baclofen  5 MG TABS Take 1 tablet (5 mg total) by mouth every 8 (eight) hours as needed.   Blood Pressure Monitoring (BLOOD PRESSURE CUFF) MISC Check blood pressure as instructed by your physician   diazepam  (VALIUM ) 2 MG tablet Take 1 tablet (2 mg total) by mouth every 6 (six) hours as needed (vertigo).   erythromycin  ophthalmic ointment Place a 1/2 inch ribbon of ointment into the lower eyelid tid x 7 days   lisinopril  (ZESTRIL ) 10 MG tablet Take 1  tablet (10 mg total) by mouth daily.   meclizine  (ANTIVERT ) 12.5 MG tablet Take 1-2 tablets (12.5-25 mg total) by mouth 3 (three) times daily as needed for dizziness.   metFORMIN  (GLUCOPHAGE ) 1000 MG tablet TAKE 1 TABLET (1,000 MG TOTAL) BY MOUTH TWICE A DAY WITH FOOD   tadalafil (CIALIS) 5 MG tablet Take 5 mg by mouth as needed for erectile dysfunction.   testosterone cypionate (DEPOTESTOSTERONE CYPIONATE) 200 MG/ML injection Inject 200 mg into the skin 2 (two) times  a week.   valACYclovir  (VALTREX ) 500 MG tablet TAKE 1 TABLET (500 MG TOTAL) BY MOUTH 2 (TWO) TIMES DAILY. RASH ON HEAD   warfarin (COUMADIN ) 1 MG tablet TAKE 3 TABLETS BY MOUTH DAILY OR AS DIRECTED BY COUMADIN  CLINIC   warfarin (COUMADIN ) 5 MG tablet TAKE 1 TABLET BY MOUTH DAILY AS DIRECTED BY THE COUMADIN  CLINIC OVER 30 DAYS   No facility-administered encounter medications on file as of 04/03/2024.  :   Review of Systems:  Out of a complete 14 point review of systems, all are reviewed and negative with the exception of these symptoms as listed below:  Review of Systems  Neurological:        Pt here for sleep consult Pt snores,hypertension, type 2 diabetes. Pt denies snoring,headaches. Pt had sleep study 7 years ago in [place] and no longer has cpap machine    ESS:3 FSS:9      Objective:  Neurological Exam  Physical Exam Physical Examination:   Vitals:   04/03/24 1422  BP: 132/78  Pulse: 79    General Examination: The patient is a very pleasant 73 y.o. male in no acute distress. He appears well-developed and well-nourished and well groomed.   HEENT: Normocephalic, atraumatic, pupils are equal, round and reactive to light, extraocular tracking is good without limitation to gaze excursion or nystagmus noted. Hearing is grossly intact. Face is symmetric with normal facial animation. Speech is clear with no dysarthria noted. There is no hypophonia. There is no lip, neck/head, jaw or voice tremor. Neck is supple with  full range of passive and active motion. There are no carotid bruits on auscultation. Oropharynx exam reveals: mild mouth dryness, adequate dental hygiene and moderate airway crowding, due to thicker soft palate, small airway entry, uvula not fully visualized, Mallampati class IV.  Wider tongue noted.  Tongue protrudes centrally.  Neck circumference 18 1/4 inches, no significant overbite noted.  Chest: Clear to auscultation without wheezing, rhonchi or crackles noted.  Heart: S1+S2+0, regular and normal without murmurs, rubs or gallops noted.   Abdomen: Soft, non-tender and non-distended.  Extremities: There is no pitting edema in the distal lower extremities bilaterally.   Skin: Warm and dry without trophic changes noted.   Musculoskeletal: exam reveals soft elastic knee brace around the right knee.  He had quadriceps tendon surgery about 5 years ago he states.  Neurologically:  Mental status: The patient is awake, alert and oriented in all 4 spheres. His immediate and remote memory, attention, language skills and fund of knowledge are appropriate. There is no evidence of aphasia, agnosia, apraxia or anomia. Speech is clear with normal prosody and enunciation. Thought process is linear. Mood is normal and affect is normal.  Cranial nerves II - XII are as described above under HEENT exam.  Motor exam: Normal bulk, strength and tone is noted. There is no obvious action or resting tremor.  Fine motor skills and coordination: grossly intact.  Cerebellar testing: No dysmetria or intention tremor. There is no truncal or gait ataxia.  Sensory exam: intact to light touch in the upper and lower extremities.  Gait, station and balance: He stands easily. No veering to one side is noted. No leaning to one side is noted. Posture is age-appropriate and stance is narrow based. Gait shows normal stride length and normal pace. No problems turning are noted.   Assessment and Plan:  In summary, Eisen Robenson  is a very pleasant 73 y.o.-year old male with an underlying medical history of antiphospholipid  antibody syndrome, DVT, PE, hypertension, diabetes, vertigo (for which he recently saw Dr. Georjean at [facility] neurology), low back pain, and obesity, who presents for evaluation of his obstructive sleep apnea.  [place] and has not been on PAP therapy for years.  He would be willing to get reevaluated and consider PAP therapy again.   A laboratory attended sleep study is typically considered gold standard for evaluation of sleep disordered breathing.   I had a long chat with the patient about my findings and the diagnosis of sleep apnea, particularly OSA, its prognosis and treatment options. We talked about medical/conservative treatments, surgical interventions and non-pharmacological approaches for symptom control. I explained, in particular, the risks and ramifications of untreated moderate to severe OSA, especially with respect to developing cardiovascular disease down the road, including congestive heart failure (CHF), difficult to treat hypertension, cardiac arrhythmias (particularly A-fib), neurovascular complications including TIA, stroke and dementia. Even type 2 diabetes has, in part, been linked to untreated OSA. Symptoms of untreated OSA may include (but may not be limited to) daytime sleepiness, nocturia (i.e. frequent nighttime urination), memory problems, mood irritability and suboptimally controlled or worsening mood disorder such as depression and/or anxiety, lack of energy, lack of motivation, physical discomfort, as well as recurrent headaches, especially morning or nocturnal headaches. We talked about the importance of maintaining a healthy lifestyle and striving for healthy weight. In addition, we talked about the importance of striving for and maintaining good sleep hygiene. I recommended a sleep study at this time. I outlined the differences between a  laboratory attended sleep study which is considered more comprehensive and accurate over the option of a home sleep test (HST); the latter may lead to underestimation of sleep disordered breathing in some instances and does not help with diagnosing upper airway resistance syndrome and is not accurate enough to diagnose primary central sleep apnea typically. I outlined possible surgical and non-surgical treatment options of OSA, including the use of a positive airway pressure (PAP) device (i.e. CPAP, AutoPAP/APAP or BiPAP in certain circumstances), a custom-made dental device (aka oral appliance, which would require a referral to a specialist dentist or orthodontist typically, and is generally speaking not considered for patients with full dentures or edentulous state), upper airway surgical options, such as traditional UPPP (which is not considered a first-line treatment) or the Inspire device (hypoglossal nerve stimulator, which would involve a referral for consultation with an ENT surgeon, after careful selection, following inclusion criteria - also not first-line treatment). I explained the PAP treatment option to the patient in detail, as this is generally considered first-line treatment.  The patient indicated that he would be willing to try PAP therapy again, if the need arises. I explained the importance of being compliant with PAP treatment, not only for insurance purposes but primarily to improve patient's symptoms symptoms, and for the patient's long term health benefit, including to reduce His cardiovascular risks longer-term.    We will pick up our discussion about the next steps and treatment options after testing.  We will keep him posted as to the test results by phone call and/or MyChart messaging where possible.  We will plan to follow-up in sleep clinic accordingly as well.  I answered all his questions today and the patient was in agreement.   I encouraged him to call with any interim  questions, concerns, problems or updates or email us  through MyChart.  Generally speaking, sleep test authorizations may take up to 2 weeks,  sometimes less, sometimes longer, the patient is encouraged to get in touch with us  if they do not hear back from the sleep lab staff directly within the next 2 weeks.  Thank you very much for allowing me to participate in the care of this nice patient. If I can be of any further assistance to you please do not hesitate to call me at [phone].  Sincerely,   True Mar, MD, PhD

## 2024-04-03 NOTE — Patient Instructions (Signed)

## 2024-04-10 ENCOUNTER — Telehealth: Payer: Self-pay | Admitting: Neurology

## 2024-04-10 NOTE — Telephone Encounter (Signed)
 [place] [place] [employer] no auth req   Patient is scheduled at [facility] for 05/03/2024 at 9 pm.  Mailed packet and sent mychart

## 2024-04-18 ENCOUNTER — Other Ambulatory Visit: Payer: Self-pay

## 2024-04-18 MED ORDER — METFORMIN HCL 1000 MG PO TABS: 1000.0000 mg | ORAL_TABLET | Freq: Two times a day (BID) | ORAL | 1 refills | Status: AC

## 2024-04-28 ENCOUNTER — Telehealth: Payer: Self-pay | Admitting: *Deleted

## 2024-04-28 NOTE — Telephone Encounter (Signed)
 Called patient since INR is due; there was no answer so left a message to inquire if he went to the lab earlier today.

## 2024-04-29 LAB — PROTIME-INR
INR: 3.3 — ABNORMAL HIGH (ref 0.9–1.2)
Prothrombin Time: 33.5 s — ABNORMAL HIGH (ref 9.1–12.0)

## 2024-05-02 ENCOUNTER — Ambulatory Visit (INDEPENDENT_AMBULATORY_CARE_PROVIDER_SITE_OTHER): Admitting: *Deleted

## 2024-05-02 DIAGNOSIS — Z86718 Personal history of other venous thrombosis and embolism: Secondary | ICD-10-CM

## 2024-05-02 DIAGNOSIS — Z5181 Encounter for therapeutic drug level monitoring: Secondary | ICD-10-CM

## 2024-05-02 DIAGNOSIS — D6861 Antiphospholipid syndrome: Secondary | ICD-10-CM

## 2024-05-02 DIAGNOSIS — Z86711 Personal history of pulmonary embolism: Secondary | ICD-10-CM

## 2024-05-02 DIAGNOSIS — Z7901 Long term (current) use of anticoagulants: Secondary | ICD-10-CM

## 2024-05-02 NOTE — Patient Instructions (Addendum)
 Description   INR-3.3;Spoke with pt and instructed him not to take any warfarin today then START taking warfarin 8mg  daily except 7mg  on Tuesdays. Eat greens tonight Recheck INR in 5 weeks at [facility].  Coumadin  Clinic [phone]

## 2024-05-02 NOTE — Progress Notes (Signed)
  Description   INR-3.3;Spoke with pt and instructed him not to take any warfarin today then START taking warfarin 8mg  daily except 7mg  on Tuesdays. Eat greens tonight Recheck INR in 5 weeks at [facility].  Coumadin  Clinic [phone]

## 2024-05-03 ENCOUNTER — Ambulatory Visit (INDEPENDENT_AMBULATORY_CARE_PROVIDER_SITE_OTHER): Admitting: Neurology

## 2024-05-03 DIAGNOSIS — G478 Other sleep disorders: Secondary | ICD-10-CM

## 2024-05-03 DIAGNOSIS — R0683 Snoring: Secondary | ICD-10-CM | POA: Diagnosis not present

## 2024-05-03 DIAGNOSIS — Z8669 Personal history of other diseases of the nervous system and sense organs: Secondary | ICD-10-CM | POA: Diagnosis not present

## 2024-05-03 DIAGNOSIS — R351 Nocturia: Secondary | ICD-10-CM

## 2024-05-03 DIAGNOSIS — G4720 Circadian rhythm sleep disorder, unspecified type: Secondary | ICD-10-CM

## 2024-05-03 DIAGNOSIS — G4733 Obstructive sleep apnea (adult) (pediatric): Secondary | ICD-10-CM

## 2024-05-03 DIAGNOSIS — Z86718 Personal history of other venous thrombosis and embolism: Secondary | ICD-10-CM

## 2024-05-03 DIAGNOSIS — E669 Obesity, unspecified: Secondary | ICD-10-CM

## 2024-05-03 DIAGNOSIS — Z86711 Personal history of pulmonary embolism: Secondary | ICD-10-CM

## 2024-05-03 DIAGNOSIS — D6861 Antiphospholipid syndrome: Secondary | ICD-10-CM

## 2024-05-03 DIAGNOSIS — R0681 Apnea, not elsewhere classified: Secondary | ICD-10-CM

## 2024-05-05 NOTE — Procedures (Signed)
 Physician Interpretation: Please see link under Procedure Tab or under Encounters tab for physician report, technical report, as well as O2 titration and/or PAP titration tables (if applicable).       True Mar, MD, PhD Medical Director, [facility]) Diplomat, ABPN (Neurology and Sleep)

## 2024-05-11 NOTE — Telephone Encounter (Signed)
 Patient left a voicemail on our phone stating he was seen a couple weeks ago for his sleep study and he has not got a call back with those results and his CPAP information.

## 2024-05-15 ENCOUNTER — Other Ambulatory Visit: Payer: Self-pay | Admitting: Internal Medicine

## 2024-05-15 DIAGNOSIS — Z7901 Long term (current) use of anticoagulants: Secondary | ICD-10-CM

## 2024-05-15 NOTE — Telephone Encounter (Signed)
 Warfarin 5mg  refill Triple APS positive, DVT, PE Last INR 05/02/24 Last OV 11/23/23

## 2024-05-17 ENCOUNTER — Ambulatory Visit: Payer: Self-pay | Admitting: Neurology

## 2024-05-17 DIAGNOSIS — G4733 Obstructive sleep apnea (adult) (pediatric): Secondary | ICD-10-CM

## 2024-05-18 NOTE — Telephone Encounter (Signed)
-----   Message from True Mar sent at 05/17/2024  7:10 PM EDT ----- Patient referred by PCP, seen by me on 04/03/24, diagnostic PSG on 05/03/24.    Please call and notify the patient that the recent sleep study showed moderate obstructive sleep apnea. He did not sleep very well. Nevertheless, I recommend treatment for in the form of autoPAP. We  may consider at a CPAP titration study at a later date, if need be, which means, that we would ask him to come back in for a second sleep study with CPAP treatment. For now, I would like to start him  on a so-called autoPAP machine at home, through a DME company (of his choice, or as per insurance requirement). The DME representative will educate him on how to use the machine, how to put the mask  on, etc. I have placed an order in the chart. Please send referral, talk to patient, send report to referring MD. We will need a FU in sleep clinic for 10 weeks post-PAP set up, please arrange that  with me or one of our NPs. Thanks,   True Mar, MD, PhD [facility])    ----- Message ----- From: Mar True, MD Sent: 05/17/2024   7:08 PM EDT To: True Mar, MD

## 2024-05-18 NOTE — Telephone Encounter (Signed)
 Lvm 1st attempt by hf 05/18/24

## 2024-05-31 ENCOUNTER — Ambulatory Visit (HOSPITAL_COMMUNITY): Admission: EM | Admit: 2024-05-31 | Discharge: 2024-05-31 | Discharge status: Against Medical Advice

## 2024-05-31 NOTE — ED Notes (Signed)
 No answer

## 2024-05-31 NOTE — ED Notes (Signed)
 Pt called no answer

## 2024-06-01 ENCOUNTER — Ambulatory Visit (INDEPENDENT_AMBULATORY_CARE_PROVIDER_SITE_OTHER)

## 2024-06-01 ENCOUNTER — Encounter (HOSPITAL_COMMUNITY): Payer: Self-pay | Admitting: Emergency Medicine

## 2024-06-01 ENCOUNTER — Other Ambulatory Visit: Payer: Self-pay

## 2024-06-01 ENCOUNTER — Ambulatory Visit (HOSPITAL_COMMUNITY): Admission: EM | Admit: 2024-06-01 | Discharge: 2024-06-01 | Disposition: A | Discharge status: Home / Self Care

## 2024-06-01 DIAGNOSIS — M25512 Pain in left shoulder: Secondary | ICD-10-CM | POA: Diagnosis not present

## 2024-06-01 MED ORDER — BACLOFEN 5 MG PO TABS: 1.0000 | ORAL_TABLET | Freq: Three times a day (TID) | ORAL | 0 refills | Status: AC | PRN

## 2024-06-01 NOTE — ED Triage Notes (Signed)
 Left shoulder pain for a month.  Denies any injury at that time.  Patient reports if he attempts to raise arm above his head he has pain and if he tries to put left arm behind his back, it is extremely painful.  Patient has not had any medications

## 2024-06-01 NOTE — ED Provider Notes (Signed)
 [facility]    CSN: [id number] Arrival date & time: 06/01/24  9043      History   Chief Complaint Chief Complaint  Patient presents with   Shoulder Pain    HPI Justin Mueller is a 73 y.o. male.   Patient presents with left shoulder discomfort for about 2 months.  Patient states that he has not had any falls or recent injuries that he knows of.  Patient states that when he attempts to raise his arm above his head or tries to reach behind his back he has increased pain with this.  Patient denies taking any medication for his symptoms.  Patient denies any history of shoulder pain.  Of note patient is currently on warfarin for anticoagulation.  The history is provided by the patient and medical records.  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 (HCC)    History of pulmonary embolus (PE)    HTN (hypertension)    Low back pain 11/07/2021   Multiple subsegmental pulmonary emboli without acute cor pulmonale (HCC) 02/11/2021   T2DM (type 2 diabetes mellitus) (HCC)    Viral URI 06/23/2021    Patient Active Problem List   Diagnosis Date Noted   Snoring 02/16/2024   Vertigo 02/16/2024   Erectile dysfunction 02/02/2022   Need for vaccination with 20-polyvalent pneumococcal conjugate vaccine 02/02/2022   Encounter for screening colonoscopy 02/02/2022   HLD (hyperlipidemia) 09/02/2021   PAD (peripheral artery disease) 09/02/2021   History of DVT (deep vein thrombosis) 08/29/2021   History of pulmonary embolism 08/29/2021   Therapeutic drug monitoring 07/29/2021   Anticoagulated on Coumadin  07/29/2021   Hypertension 06/11/2021   T2DM (type 2 diabetes mellitus) (HCC) 06/11/2021   APS (antiphospholipid syndrome) 06/11/2021   Right knee injury, sequela 06/11/2021   CAD (coronary artery disease) 06/11/2021    Past Surgical History:   Procedure Laterality Date   CHOLECYSTECTOMY  2015   HERNIA REPAIR Bilateral 2015   laparoscopic       Home Medications    Prior to Admission medications   Medication Sig Start Date End Date Taking? Authorizing Provider  Baclofen  5 MG TABS Take 1 tablet (5 mg total) by mouth every 8 (eight) hours as needed for up to 10 days. 06/01/24 06/11/24 Yes Johnie, Lattie Riege A, NP  latanoprost (XALATAN) 0.005 % ophthalmic solution SMARTSIG:1 Drop(s) In Eye(s) Every Evening 12/24/23  Yes [provider]  timolol (TIMOPTIC) 0.5 % ophthalmic solution 1 drop every morning. 04/25/24  Yes [provider]  aspirin EC 81 MG tablet Take 81 mg by mouth daily. Swallow whole.    [provider]  atorvastatin  (LIPITOR) 80 MG tablet Take 1 tablet (80 mg total) by mouth daily. 11/23/23   Lucien Orren SAILOR, PA-C  Blood Pressure Monitoring (BLOOD PRESSURE CUFF) MISC Check blood pressure as instructed by your physician 06/23/23   Lucien Orren SAILOR, PA-C  diazepam  (VALIUM ) 2 MG tablet Take 1 tablet (2 mg total) by mouth every 6 (six) hours as needed (vertigo). 10/05/23   Harris, Abigail, PA-C  lisinopril  (ZESTRIL ) 10 MG tablet Take 1 tablet (10 mg total) by mouth daily. 11/23/23   Lucien Orren SAILOR, PA-C  meclizine  (ANTIVERT ) 12.5 MG tablet Take 1-2 tablets (12.5-25 mg total) by mouth 3 (three) times daily as needed for dizziness. 02/16/24   Gregary Sharper, MD  metFORMIN  (GLUCOPHAGE ) 1000 MG  tablet Take 1 tablet (1,000 mg total) by mouth 2 (two) times daily with a meal. 04/18/24   Marylu Gee, DO  tadalafil (CIALIS) 5 MG tablet Take 5 mg by mouth as needed for erectile dysfunction.    [provider]  testosterone cypionate (DEPOTESTOSTERONE CYPIONATE) 200 MG/ML injection Inject 200 mg into the skin 2 (two) times a week. 06/21/23   [provider]  valACYclovir  (VALTREX ) 500 MG tablet TAKE 1 TABLET (500 MG TOTAL) BY MOUTH 2 (TWO) TIMES DAILY. RASH ON HEAD 11/30/22   Emmy Justus DEL, MD   warfarin (COUMADIN ) 1 MG tablet TAKE 3 TABLETS BY MOUTH DAILY OR AS DIRECTED BY COUMADIN  CLINIC 12/15/23   Okey Vina GAILS, MD  warfarin (COUMADIN ) 5 MG tablet TAKE 1 TABLET BY MOUTH DAILY AS DIRECTED BY THE COUMADIN  CLINIC (USING WITH 1MG  TABLET) 05/15/24   Okey Vina GAILS, MD    Family History Family History  Problem Relation Age of Onset   Cervical cancer Mother    Heart disease Father    Prostate cancer Father    Diabetes Sister    Sleep apnea Neg Hx     Social History Social History   Tobacco Use   Smoking status: Never   Smokeless tobacco: Never  Vaping Use   Vaping status: Never Used  Substance Use Topics   Alcohol use: Yes    Alcohol/week: 2.0 standard drinks of alcohol    Types: 2 Glasses of wine per week    Comment: occasional   Drug use: Never     Allergies   Patient has no known allergies.   Review of Systems Review of Systems  Per HPI  Physical Exam Triage Vital Signs ED Triage Vitals  Encounter Vitals Group     BP 06/01/24 1017 131/82     Girls Systolic BP Percentile --      Girls Diastolic BP Percentile --      Boys Systolic BP Percentile --      Boys Diastolic BP Percentile --      Pulse Rate 06/01/24 1017 (!) 58     Resp 06/01/24 1017 20     Temp 06/01/24 1017 98.2 F (36.8 C)     Temp Source 06/01/24 1017 Oral     SpO2 06/01/24 1017 93 %     Weight --      Height --      Head Circumference --      Peak Flow --      Pain Score 06/01/24 1014 8     Pain Loc --      Pain Education --      Exclude from Growth Chart --    No data found.  Updated Vital Signs BP 131/82 (BP Location: Right Arm) Comment (BP Location): large cuff  Pulse (!) 58   Temp 98.2 F (36.8 C) (Oral)   Resp 20   SpO2 93%   Visual Acuity Right Eye Distance:   Left Eye Distance:   Bilateral Distance:    Right Eye Near:   Left Eye Near:    Bilateral Near:     Physical Exam Vitals and nursing note reviewed.  Constitutional:      General: He is awake. He is  not in acute distress.    Appearance: Normal appearance. He is well-developed and well-groomed. He is not ill-appearing.  Musculoskeletal:     Left shoulder: Tenderness present. No swelling or deformity. Decreased range of motion. Normal strength.     Comments:  Mild tenderness described as soreness over AC joint. Increased pain with shoulder flexion and extension  Skin:    General: Skin is warm and dry.  Neurological:     Mental Status: He is alert.  Psychiatric:        Behavior: Behavior is cooperative.      UC Treatments / Results  Labs (all labs ordered are listed, but only abnormal results are displayed) Labs Reviewed - No data to display  EKG   Radiology DG Shoulder Left Result Date: 06/01/2024 EXAM: 1 VIEW XRAY OF THE LEFT SHOULDER 06/01/2024 10:56:53 AM COMPARISON: None available. CLINICAL HISTORY: Left shoulder pain for 1 month, difficulty raising arm and reaching behind back. FINDINGS: BONES AND JOINTS: Glenohumeral joint is normally aligned. No acute fracture or dislocation. Mild degenerative changes of the acromioclavicular joint. Mild spurring of the glenoid. Mild thoracic spondylosis. SOFT TISSUES: No abnormal calcifications. Visualized lung is unremarkable. IMPRESSION: 1. Mild degenerative changes of the acromioclavicular joint. 2. Mild glenoid spurring. 3. Mild thoracic spondylosis. 4. No acute abnormalities. Electronically signed by: Ryan Salvage MD 06/01/2024 11:20 AM EDT RP Workstation: [id number]    Procedures Procedures (including critical care time)  Medications Ordered in UC Medications - No data to display  Initial Impression / Assessment and Plan / UC Course  I have reviewed the triage vital signs and the nursing notes.  Pertinent labs & imaging results that were available during my care of the patient were reviewed by me and considered in my medical decision making (see chart for details).     Patient is overall well-appearing.  Vitals are  stable.  X-ray ordered.  Based on my interpretation there is no acute osseous abnormality.  Radiology report confirms this.  Radiology report also reveals mild degenerative changes of the [facility] joint, glenoid spurring, and thoracic spondylosis.  Based on exam concerned about underlying rotator cuff tendinitis or injury.  Prescribed baclofen  as needed for pain as this does not interact with warfarin.  Recommended Tylenol as needed for pain as well.  Given orthopedic follow-up for further evaluation.  Discussed follow-up and return precautions. Final Clinical Impressions(s) / UC Diagnoses   Final diagnoses:  Acute pain of left shoulder     Discharge Instructions      Your x-ray is negative for any underlying fracture or dislocation. I believe your pain is likely from rotator cuff tendinitis or possibly wear-and-tear of the rotator cuff. You can take baclofen  every 8 hours as needed to help with this pain. Otherwise you can take 500 to 1000 mg of Tylenol every 6-8 hours as needed for breakthrough pain.  Do not exceed 4000 mg in 1 day. You can alternate between ice and heat and do some gentle stretching to help with pain. Recommend following up with [place] [facility] for further evaluation if your pain continues to determine if there is an underlying rotator cuff injury. Otherwise follow-up with your primary care provider or return here as needed.     ED Prescriptions     Medication Sig Dispense Auth. Provider   Baclofen  5 MG TABS Take 1 tablet (5 mg total) by mouth every 8 (eight) hours as needed for up to 10 days. 30 tablet Johnie Flaming A, NP      PDMP not reviewed this encounter.   Johnie Flaming A, NP 06/01/24 [phone]

## 2024-06-01 NOTE — Discharge Instructions (Signed)
 Your x-ray is negative for any underlying fracture or dislocation. I believe your pain is likely from rotator cuff tendinitis or possibly wear-and-tear of the rotator cuff. You can take baclofen  every 8 hours as needed to help with this pain. Otherwise you can take 500 to 1000 mg of Tylenol every 6-8 hours as needed for breakthrough pain.  Do not exceed 4000 mg in 1 day. You can alternate between ice and heat and do some gentle stretching to help with pain. Recommend following up with [place] [facility] for further evaluation if your pain continues to determine if there is an underlying rotator cuff injury. Otherwise follow-up with your primary care provider or return here as needed.

## 2024-06-06 ENCOUNTER — Telehealth: Payer: Self-pay | Admitting: *Deleted

## 2024-06-06 ENCOUNTER — Other Ambulatory Visit: Payer: Self-pay | Admitting: *Deleted

## 2024-06-06 DIAGNOSIS — Z7901 Long term (current) use of anticoagulants: Secondary | ICD-10-CM

## 2024-06-06 DIAGNOSIS — Z86718 Personal history of other venous thrombosis and embolism: Secondary | ICD-10-CM

## 2024-06-06 DIAGNOSIS — Z86711 Personal history of pulmonary embolism: Secondary | ICD-10-CM

## 2024-06-06 NOTE — Telephone Encounter (Signed)
 Spoke with patient and advised that INR is due today. 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 Advised will follow up once results received.

## 2024-06-07 ENCOUNTER — Ambulatory Visit (INDEPENDENT_AMBULATORY_CARE_PROVIDER_SITE_OTHER): Admitting: *Deleted

## 2024-06-07 ENCOUNTER — Ambulatory Visit: Payer: Self-pay | Admitting: Internal Medicine

## 2024-06-07 DIAGNOSIS — Z7901 Long term (current) use of anticoagulants: Secondary | ICD-10-CM

## 2024-06-07 DIAGNOSIS — Z5181 Encounter for therapeutic drug level monitoring: Secondary | ICD-10-CM

## 2024-06-07 DIAGNOSIS — Z86711 Personal history of pulmonary embolism: Secondary | ICD-10-CM

## 2024-06-07 DIAGNOSIS — Z86718 Personal history of other venous thrombosis and embolism: Secondary | ICD-10-CM

## 2024-06-07 LAB — PROTIME-INR
INR: 2 — ABNORMAL HIGH (ref 0.9–1.2)
Prothrombin Time: 20.9 s — ABNORMAL HIGH (ref 9.1–12.0)

## 2024-06-07 NOTE — Progress Notes (Signed)
 Description   INR-2.0; Spoke with pt and instructed him to take 9mg  of warfarin today then resume taking the dose you have been taking, which is warfarin 8mg  daily. Recheck INR in 5 weeks at [facility].  Coumadin  Clinic [phone]

## 2024-06-07 NOTE — Patient Instructions (Addendum)
 Description   INR-2.0; Spoke with pt and instructed him to take 9mg  of warfarin today then resume taking the dose you have been taking, which is warfarin 8mg  daily. Recheck INR in 5 weeks at [facility].  Coumadin  Clinic [phone]

## 2024-06-13 ENCOUNTER — Other Ambulatory Visit: Payer: Self-pay

## 2024-06-13 ENCOUNTER — Encounter: Payer: Self-pay | Admitting: Family Medicine

## 2024-06-13 ENCOUNTER — Ambulatory Visit (INDEPENDENT_AMBULATORY_CARE_PROVIDER_SITE_OTHER): Admitting: Family Medicine

## 2024-06-13 VITALS — BP 126/88 | Ht 66.0 in | Wt 210.0 lb

## 2024-06-13 DIAGNOSIS — Z7901 Long term (current) use of anticoagulants: Secondary | ICD-10-CM | POA: Diagnosis not present

## 2024-06-13 DIAGNOSIS — M19012 Primary osteoarthritis, left shoulder: Secondary | ICD-10-CM | POA: Diagnosis not present

## 2024-06-13 DIAGNOSIS — M67912 Unspecified disorder of synovium and tendon, left shoulder: Secondary | ICD-10-CM

## 2024-06-13 DIAGNOSIS — M25512 Pain in left shoulder: Secondary | ICD-10-CM

## 2024-06-13 DIAGNOSIS — E119 Type 2 diabetes mellitus without complications: Secondary | ICD-10-CM | POA: Diagnosis not present

## 2024-06-13 NOTE — Patient Instructions (Signed)
 Follow up in 6 weeks with me.

## 2024-06-13 NOTE — Progress Notes (Signed)
 DATE OF VISIT: 06/13/2024        Justin Mueller DOB: November 20, 1950 MRN: [id number]  Discussed the use of AI scribe software for clinical note transcription with the patient, who gave verbal consent to proceed.  History of Present Illness Justin Mueller is a 73 year old male with type 2 diabetes and antiphospholipid syndrome on chronic coumadin  who presents with left shoulder pain. He was referred by urgent care for evaluation of left shoulder pain. 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  Imaging and prior evaluation - Evaluated at urgent care on June 01, 2024 - Left shoulder x-rays demonstrated mild arthritis in the glenohumeral and acromioclavicular (AC) joints  Analgesic use - No use of Tylenol or ibuprofen for shoulder pain  Anticoagulation therapy - Currently taking aspirin and Coumadin  for antiphospholipid syndrome - INR two weeks ago was 2.0 on 06/06/24 - No bleeding or bruising  Diabetes mellitus type 2 - Last known hemoglobin A1c was 7.5% in February 2025    Medications:  Outpatient Encounter Medications as of 06/13/2024  Medication Sig   aspirin EC 81 MG tablet Take 81 mg by mouth Mueller. Swallow whole.   atorvastatin  (LIPITOR) 80 MG tablet Take 1 tablet (80 mg total) by mouth Mueller.   Blood Pressure Monitoring (BLOOD PRESSURE CUFF) MISC Check blood pressure as instructed by your physician   diazepam  (VALIUM ) 2 MG tablet Take 1 tablet (2 mg total) by mouth every 6 (six) hours as needed (vertigo).   latanoprost (XALATAN)  0.005 % ophthalmic solution SMARTSIG:1 Drop(s) In Eye(s) Every Evening   lisinopril  (ZESTRIL ) 10 MG tablet Take 1 tablet (10 mg total) by mouth Mueller.   meclizine  (ANTIVERT ) 12.5 MG tablet Take 1-2 tablets (12.5-25 mg total) by mouth 3 (three) times Mueller as needed for dizziness.   metFORMIN  (GLUCOPHAGE ) 1000 MG tablet Take 1 tablet (1,000 mg total) by mouth 2 (two) times Mueller with a meal.   tadalafil (CIALIS) 5 MG tablet Take 5 mg by mouth as needed for erectile dysfunction.   testosterone cypionate (DEPOTESTOSTERONE CYPIONATE) 200 MG/ML injection Inject 200 mg into the skin 2 (two) times a week.   timolol (TIMOPTIC) 0.5 % ophthalmic solution 1 drop every morning.   valACYclovir  (VALTREX ) 500 MG tablet TAKE 1 TABLET (500 MG TOTAL) BY MOUTH 2 (TWO) TIMES Mueller. RASH ON HEAD   warfarin (COUMADIN ) 1 MG tablet TAKE 3 TABLETS BY MOUTH Mueller OR AS DIRECTED BY COUMADIN  CLINIC   warfarin (COUMADIN ) 5 MG tablet TAKE 1 TABLET BY MOUTH Mueller AS DIRECTED BY THE COUMADIN  CLINIC (USING WITH 1MG  TABLET)   No facility-administered encounter medications on file as of 06/13/2024.    Allergies: has no known allergies.  Physical Examination: Vitals: BP 126/88   Ht 5' 6 (1.676 m)   Wt 210 lb (95.3 kg)   BMI 33.89 kg/m  GENERAL:  Justin Mueller is a 73 y.o. male appearing their stated age, alert and oriented x 3, in no apparent distress.  SKIN: no rashes or lesions, skin clean, dry, intact MSK: Shoulder: Left shoulder  without any gross deformity.  Near full range of motion with positive painful arc.  Tender palpation over the [facility] joint and bicipital groove.  Positive empty can, positive Hawkins, positive Neer.  Negative drop arm.  Rotator cuff strength 4/5 throughout. Right shoulder full range of motion without pain, weakness, instability Normal grip strength bilaterally NEURO: sensation intact to light touch, DTR 2/4 bicep, tricep, brachial radialis bilaterally VASC: pulses 2+ and symmetric radial artery  bilaterally, no edema  Radiology: Left shoulder x-ray 09/02/2023 at urgent care personally reviewed and interpreted by me today showing: - Mild AC joint osteoarthritis - Mild glenohumeral arthritis - No other acute abnormalities - Thoracic spondylosis, although incompletely evaluated on shoulder imaging  MS[facility] ultrasound left shoulder Date: 06/13/2024 Indication: Left shoulder pain Findings: - Biceps tendon with hypoechoic change, no overt tearing.  Well-visualized in short and long axis views - Normal-appearing pectoralis insertion on the proximal humerus - Subscapularis with hypoechoic change.  Possible small articular-sided partial thickness tear.  Well-seen in short and long axis views.  Small calcification noted in that area. - No dynamic impingement on the coracoid - Small amount of increased fluid in the subacromial bursa.  No significant dynamic subacromial impingement - Supraspinatus with hypoechoic change, no overt tearing.  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 No full thickness or significant tearing - recommend OTC Voltaren gel 3-4 times Mueller.  Safe to use with chronic anticoagulation - Physical therapy 1-2 times weekly for 6 weeks - referral given - Consider cortisone injection if symptoms persist and INR stable. - Follow-up in 6-8 weeks.  Left shoulder osteoarthritis X-ray shows mild  osteoarthritis in shoulder and AC joint and GH joint, consistent with age-related changes. - plans as noted above  Type 2 diabetes mellitus A1c of 7.5% in February indicates moderate control.  Antiphospholipid syndrome on chronic anticoagulation On Coumadin  with INR of 2.0. No bleeding or bruising issues. - Monitor INR levels regularly as directed - could consider cortisone injection in the future if symptoms worsening and INR is stable     Patient expressed understanding & agreement with above.  Encounter Diagnoses  Name Primary?   Tendinopathy of left rotator cuff Yes   Osteoarthritis of glenohumeral joint, left    Osteoarthritis of left AC (acromioclavicular) joint    Type 2 diabetes mellitus without complication, without long-term current use of insulin (HCC)    Anticoagulated on Coumadin      Orders Placed This Encounter  Procedures   US  COMPLETE JOINT SPACE STRUCTURE UP LEFT   Ambulatory referral to Physical Therapy     VISIT SUMMARY: You were seen today for left shoulder pain that has been bothering you for the past 2-3 months. The pain worsens with certain movements and activities. You have a history of type 2 diabetes and antiphospholipid syndrome. Imaging and evaluations have shown mild arthritis and partial tearing in your shoulder tendons.  YOUR PLAN: -LEFT SHOULDER ROTATOR CUFF TENDINOPATHY WITH PARTIAL TEARING: This condition involves inflammation and damage to the tendons in your shoulder, which can cause  pain and limit movement. You should apply Voltaren gel 3-4 times Mueller and attend physical therapy 1-2 times weekly for 6 weeks. If your symptoms do not improve and your INR levels are stable, we may consider a cortisone injection. Please follow up in 6-8 weeks.  -LEFT SHOULDER OSTEOARTHRITIS: This is a type of arthritis that occurs when the cartilage in your shoulder joint wears down over time. You should apply Voltaren gel 3-4 times Mueller and attend physical  therapy 1-2 times weekly for 6 weeks. If your symptoms do not improve and your INR levels are stable, we may consider a cortisone injection. Please follow up in 6-8 weeks.  -TYPE 2 DIABETES MELLITUS: This is a chronic condition that affects the way your body processes blood sugar. Your last A1c was 7.5% in February, indicating moderate control. Continue to monitor your blood sugar levels and follow your diabetes management plan.  -ANTIPHOSPHOLIPID SYNDROME: This is an autoimmune disorder that increases the risk of blood clots. You are currently taking Coumadin  and your INR level was 2.0, which is within the target range. Continue to monitor your INR levels regularly.  INSTRUCTIONS: Please follow up in 6-8 weeks for your shoulder issues. Continue to monitor your blood sugar levels and INR levels regularly. Contains text generated by [employer].

## 2024-07-03 NOTE — Therapy (Incomplete)
 OUTPATIENT PHYSICAL THERAPY SHOULDER EVALUATION   Patient Name: Justin Mueller MRN: [id number] DOB:28-Jan-1951, 73 y.o., male Today's Date: 07/03/2024  END OF SESSION:   Past Medical History:  Diagnosis Date   APS (antiphospholipid syndrome)    Chronic deep vein thrombosis (DVT) of calf muscle vein of right lower extremity (HCC)    History of pulmonary embolus (PE)    HTN (hypertension)    Low back pain 11/07/2021   Multiple subsegmental pulmonary emboli without acute cor pulmonale (HCC) 02/11/2021   T2DM (type 2 diabetes mellitus) (HCC)    Viral URI 06/23/2021   Past Surgical History:  Procedure Laterality Date   CHOLECYSTECTOMY  2015   HERNIA REPAIR Bilateral 2015   laparoscopic   Patient Active Problem List   Diagnosis Date Noted   Snoring 02/16/2024   Vertigo 02/16/2024   Erectile dysfunction 02/02/2022   Need for vaccination with 20-polyvalent pneumococcal conjugate vaccine 02/02/2022   Encounter for screening colonoscopy 02/02/2022   HLD (hyperlipidemia) 09/02/2021   PAD (peripheral artery disease) 09/02/2021   History of DVT (deep vein thrombosis) 08/29/2021   History of pulmonary embolism 08/29/2021   Therapeutic drug monitoring 07/29/2021   Anticoagulated on Coumadin  07/29/2021   Hypertension 06/11/2021   T2DM (type 2 diabetes mellitus) (HCC) 06/11/2021   APS (antiphospholipid syndrome) 06/11/2021   Right knee injury, sequela 06/11/2021   CAD (coronary artery disease) 06/11/2021    PCP: Moishe Chiquita HERO, NP  REFERRING PROVIDER: Teressa Rainell BROCKS, DO  REFERRING DIAG: [phone] (ICD-10-CM) - Acute pain of left shoulder   THERAPY DIAG:  No diagnosis found.  Rationale for Evaluation and Treatment: Rehabilitation  ONSET DATE: ***  SUBJECTIVE:                                                                                                                                                                                      SUBJECTIVE STATEMENT: *** Hand  dominance: {MISC; OT HAND [phone]  PERTINENT HISTORY: ***  PAIN:  Are you having pain? Yes: NPRS scale: *** Pain location: *** Pain description: *** Aggravating factors: *** Relieving factors: ***  PRECAUTIONS: {Therapy precautions:24002}  RED FLAGS: {PT Red Flags:29287}   WEIGHT BEARING RESTRICTIONS: {Yes ***/No:24003}  FALLS:  Has patient fallen in last 6 months? {fallsyesno:27318}  LIVING ENVIRONMENT: Lives with: {[facility] lives with:25569::lives with their family} Lives in: {Lives in:25570} Stairs: {opstairs:27293} Has following equipment at home: {Assistive devices:23999}  OCCUPATION: ***  PLOF: {PLOF:24004}  PATIENT GOALS:***  NEXT MD VISIT:   OBJECTIVE:  Note: Objective measures were completed at Evaluation unless otherwise noted.  DIAGNOSTIC FINDINGS:  06/01/24 L shoulder IMPRESSION: 1. Mild degenerative changes of the acromioclavicular joint. 2. Mild  glenoid spurring. 3. Mild thoracic spondylosis. 4. No acute abnormalities.  06/13/24  [facility] US  L shoulder  Findings: - Biceps tendon with hypoechoic change, no overt tearing.  Well-visualized in short and long axis views - Normal-appearing pectoralis insertion on the proximal humerus - Subscapularis with hypoechoic change.  Possible small articular-sided partial thickness tear.  Well-seen in short and long axis views.  Small calcification noted in that area. - No dynamic impingement on the coracoid - Small amount of increased fluid in the subacromial bursa.  No significant dynamic subacromial impingement - Supraspinatus with hypoechoic change, no overt tearing.  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 - AC joint and glenohumeral osteoarthritis - Mild  subacromial bursitis Images and interpretation completed by Rainell Cedar, DO   PATIENT SURVEYS:  {rehab surveys:24030:a}  COGNITION: Overall cognitive status: {cognition:24006}     SENSATION: {sensation:27233}  POSTURE: ***  UPPER EXTREMITY ROM:   {AROM/PROM:27142} ROM Right eval Left eval  Shoulder flexion    Shoulder extension    Shoulder abduction    Shoulder adduction    Shoulder internal rotation    Shoulder external rotation    Elbow flexion    Elbow extension    Wrist flexion    Wrist extension    Wrist ulnar deviation    Wrist radial deviation    Wrist pronation    Wrist supination    (Blank rows = not tested)  UPPER EXTREMITY MMT:  MMT Right eval Left eval  Shoulder flexion    Shoulder extension    Shoulder abduction    Shoulder adduction    Shoulder internal rotation    Shoulder external rotation    Middle trapezius    Lower trapezius    Elbow flexion    Elbow extension    Wrist flexion    Wrist extension    Wrist ulnar deviation    Wrist radial deviation    Wrist pronation    Wrist supination    Grip strength (lbs)    (Blank rows = not tested)  SHOULDER SPECIAL TESTS: Impingement tests: {shoulder impingement test:25231:a} SLAP lesions: {SLAP lesions:25232} Instability tests: {shoulder instability test:25233} Rotator cuff assessment: {rotator cuff assessment:25234} Biceps assessment: {biceps assessment:25235}  JOINT MOBILITY TESTING:  ***  PALPATION:  ***                                                                                                                             TREATMENT DATE: ***   PATIENT EDUCATION: Education details: *** Person educated: {Person educated:25204} Education method: {Education Method:25205} Education comprehension: {Education Comprehension:25206}  HOME EXERCISE PROGRAM: ***  ASSESSMENT:  CLINICAL IMPRESSION: Patient is a 73 y.o. male who was seen today for physical therapy evaluation and  treatment for M25.512 (ICD-10-CM) - Acute pain  of left shoulder. Evaluation and Treatment. Left shoulder rotator cuff tendinopathy.   OBJECTIVE IMPAIRMENTS: {opptimpairments:25111}.   ACTIVITY LIMITATIONS: {activitylimitations:27494}  PARTICIPATION LIMITATIONS: {participationrestrictions:25113}  PERSONAL FACTORS: {Personal factors:25162} are also affecting patient's functional outcome.   REHAB POTENTIAL: {rehabpotential:25112}  CLINICAL DECISION MAKING: {clinical decision making:25114}  EVALUATION COMPLEXITY: {Evaluation complexity:25115}   GOALS: M25.512 (ICD-10-CM) - Acute pain of left shoulder   SHORT TERM GOALS: Target date: ***  *** Baseline: Goal status: INITIAL  2.  *** Baseline:  Goal status: INITIAL  3.  *** Baseline:  Goal status: INITIAL  4.  *** Baseline:  Goal status: INITIAL  5.  *** Baseline:  Goal status: INITIAL  6.  *** Baseline:  Goal status: INITIAL  LONG TERM GOALS: Target date: ***  *** Baseline:  Goal status: INITIAL  2.  *** Baseline:  Goal status: INITIAL  3.  *** Baseline:  Goal status: INITIAL  4.  *** Baseline:  Goal status: INITIAL  5.  *** Baseline:  Goal status: INITIAL  6.  *** Baseline:  Goal status: INITIAL  PLAN:  PT FREQUENCY: {rehab frequency:25116}  PT DURATION: {rehab duration:25117}  PLANNED INTERVENTIONS: {rehab planned interventions:25118::97110-Therapeutic exercises,97530- Therapeutic [id number]- Neuromuscular re-education,97535- Self Rjmz,02859- Manual therapy,Patient/Family education}  PLAN FOR NEXT SESSION: PIERRETTE Dasie Daft, PT 07/03/2024, 8:23 PM

## 2024-07-04 ENCOUNTER — Ambulatory Visit: Attending: Family Medicine

## 2024-07-12 ENCOUNTER — Telehealth: Payer: Self-pay | Admitting: *Deleted

## 2024-07-12 ENCOUNTER — Other Ambulatory Visit (HOSPITAL_COMMUNITY): Admit: 2024-07-12

## 2024-07-12 ENCOUNTER — Other Ambulatory Visit: Payer: Self-pay | Admitting: *Deleted

## 2024-07-12 DIAGNOSIS — Z86711 Personal history of pulmonary embolism: Secondary | ICD-10-CM

## 2024-07-12 DIAGNOSIS — Z7901 Long term (current) use of anticoagulants: Secondary | ICD-10-CM

## 2024-07-12 DIAGNOSIS — Z86718 Personal history of other venous thrombosis and embolism: Secondary | ICD-10-CM

## 2024-07-12 NOTE — Telephone Encounter (Signed)
 Called patient since INR is due, he states he will go to the lab today around 11am. Will follow up.

## 2024-07-12 NOTE — Telephone Encounter (Signed)
 Called patient to inquire if he had gone to have labs drawn per our conversation earlier and there was no answer and no voicemail.

## 2024-07-13 ENCOUNTER — Other Ambulatory Visit: Payer: Self-pay

## 2024-07-13 ENCOUNTER — Ambulatory Visit (INDEPENDENT_AMBULATORY_CARE_PROVIDER_SITE_OTHER): Admitting: Internal Medicine

## 2024-07-13 DIAGNOSIS — Z7901 Long term (current) use of anticoagulants: Secondary | ICD-10-CM

## 2024-07-13 DIAGNOSIS — Z86718 Personal history of other venous thrombosis and embolism: Secondary | ICD-10-CM

## 2024-07-13 DIAGNOSIS — Z86711 Personal history of pulmonary embolism: Secondary | ICD-10-CM

## 2024-07-13 DIAGNOSIS — Z5181 Encounter for therapeutic drug level monitoring: Secondary | ICD-10-CM

## 2024-07-13 LAB — PROTIME-INR
INR: 1.7 — ABNORMAL HIGH (ref 0.9–1.2)
Prothrombin Time: 18.3 s — ABNORMAL HIGH (ref 9.1–12.0)

## 2024-07-13 NOTE — Progress Notes (Signed)
 INR 1.7 Please see anticoagulation encounter  Spoke with pt and instructed him to take 13 mg of warfarin today then resume taking the dose you have been taking, which is warfarin 8mg  daily. Recheck INR in 5 weeks at [facility].  Coumadin  Clinic [phone]

## 2024-07-25 ENCOUNTER — Ambulatory Visit: Admitting: Family Medicine

## 2024-07-31 ENCOUNTER — Encounter: Admitting: Neurology

## 2024-08-08 ENCOUNTER — Other Ambulatory Visit: Payer: Self-pay

## 2024-08-08 ENCOUNTER — Inpatient Hospital Stay (HOSPITAL_COMMUNITY): Admission: RE | Admit: 2024-08-08 | Discharge: 2024-08-08

## 2024-08-08 ENCOUNTER — Encounter (HOSPITAL_COMMUNITY): Payer: Self-pay

## 2024-08-08 VITALS — BP 129/82 | HR 60 | Temp 98.6°F | Resp 18

## 2024-08-08 DIAGNOSIS — R0789 Other chest pain: Secondary | ICD-10-CM

## 2024-08-08 MED ORDER — LIDOCAINE 5 % EX PTCH: 1.0000 | MEDICATED_PATCH | CUTANEOUS | 0 refills | Status: DC

## 2024-08-08 MED ORDER — METHOCARBAMOL 500 MG PO TABS: 500.0000 mg | ORAL_TABLET | Freq: Two times a day (BID) | ORAL | 0 refills | Status: AC

## 2024-08-08 NOTE — ED Provider Notes (Signed)
 [facility]    CSN: [id number] Arrival date & time: 08/08/24  1020      History   Chief Complaint Chief Complaint  Patient presents with   Abdominal Pain    HPI Shaylon Aden is a 73 y.o. male.   Mr. Strollo is a 73 year old male who presents today with complaint of right sided upper abdominal pain that began 2 weeks ago.  He denies any known causative factor.  He reports that the pain has been unchanged since onset.  He reports that he has been experiencing sharp well localized pain that does not radiate.  It is a 4 out of 10 in the morning upon awakening but progresses to a 6-8 out of 10 at the end of the day.  He reports that is made worse with lifting his right arm, coughing, and deep breathing.  He denies nausea, vomiting, diarrhea, changes in bowel movements and appetite, and skin changes/rashes.  He denies recent illness, chest pain, shortness of breath, wheezing, and coughing.  He denies any changes in activity or recent physical exertion.  He has been taking Tylenol for his symptoms that he states has been helpful at temporarily alleviating the pain   Abdominal Pain Associated symptoms: no constipation, no cough, no diarrhea, no nausea, no shortness of breath and no vomiting     Past Medical History:  Diagnosis Date   APS (antiphospholipid syndrome)    Chronic deep vein thrombosis (DVT) of calf muscle vein of right lower extremity (HCC)    History of pulmonary embolus (PE)    HTN (hypertension)    Low back pain 11/07/2021   Multiple subsegmental pulmonary emboli without acute cor pulmonale (HCC) 02/11/2021   T2DM (type 2 diabetes mellitus) (HCC)    Viral URI 06/23/2021    Patient Active Problem List   Diagnosis Date Noted   Snoring 02/16/2024   Vertigo 02/16/2024   Erectile dysfunction 02/02/2022   Need for vaccination with 20-polyvalent pneumococcal conjugate vaccine 02/02/2022   Encounter for screening colonoscopy 02/02/2022   HLD (hyperlipidemia)  09/02/2021   PAD (peripheral artery disease) 09/02/2021   History of DVT (deep vein thrombosis) 08/29/2021   History of pulmonary embolism 08/29/2021   Therapeutic drug monitoring 07/29/2021   Anticoagulated on Coumadin  07/29/2021   Hypertension 06/11/2021   T2DM (type 2 diabetes mellitus) (HCC) 06/11/2021   APS (antiphospholipid syndrome) 06/11/2021   Right knee injury, sequela 06/11/2021   CAD (coronary artery disease) 06/11/2021    Past Surgical History:  Procedure Laterality Date   CHOLECYSTECTOMY  2015   HERNIA REPAIR Bilateral 2015   laparoscopic       Home Medications    Prior to Admission medications   Medication Sig Start Date End Date Taking? Authorizing Provider  aspirin EC 81 MG tablet Take 81 mg by mouth daily. Swallow whole.   Yes [provider]  atorvastatin  (LIPITOR) 80 MG tablet Take 1 tablet (80 mg total) by mouth daily. 11/23/23  Yes Lucien Orren SAILOR, PA-C  Blood Pressure Monitoring (BLOOD PRESSURE CUFF) MISC Check blood pressure as instructed by your physician 06/23/23  Yes Lucien Orren SAILOR, PA-C  lidocaine  (LIDODERM ) 5 % Place 1 patch onto the skin daily. Remove & Discard patch within 12 hours or as directed by MD 08/08/24  Yes Leatrice Vernell HERO, NP  lisinopril  (ZESTRIL ) 10 MG tablet Take 1 tablet (10 mg total) by mouth daily. 11/23/23  Yes Conte, Tessa N, PA-C  metFORMIN  (GLUCOPHAGE ) 1000 MG tablet Take 1 tablet (1,000  mg total) by mouth 2 (two) times daily with a meal. 04/18/24  Yes Marylu Gee, DO  methocarbamol  (ROBAXIN ) 500 MG tablet Take 1 tablet (500 mg total) by mouth 2 (two) times daily. 08/08/24  Yes Leatrice Vernell HERO, NP  timolol (TIMOPTIC) 0.5 % ophthalmic solution 1 drop every morning. 04/25/24  Yes [provider]  warfarin (COUMADIN ) 1 MG tablet TAKE 3 TABLETS BY MOUTH DAILY OR AS DIRECTED BY COUMADIN  CLINIC 12/15/23  Yes Okey Vina GAILS, MD  warfarin (COUMADIN ) 5 MG tablet TAKE 1 TABLET BY MOUTH DAILY AS DIRECTED BY THE COUMADIN   CLINIC (USING WITH 1MG  TABLET) 05/15/24  Yes Okey Vina GAILS, MD  diazepam  (VALIUM ) 2 MG tablet Take 1 tablet (2 mg total) by mouth every 6 (six) hours as needed (vertigo). 10/05/23   Harris, Abigail, PA-C  latanoprost (XALATAN) 0.005 % ophthalmic solution SMARTSIG:1 Drop(s) In Eye(s) Every Evening 12/24/23   [provider]  meclizine  (ANTIVERT ) 12.5 MG tablet Take 1-2 tablets (12.5-25 mg total) by mouth 3 (three) times daily as needed for dizziness. 02/16/24   Gregary Sharper, MD  tadalafil (CIALIS) 5 MG tablet Take 5 mg by mouth as needed for erectile dysfunction.    [provider]  testosterone cypionate (DEPOTESTOSTERONE CYPIONATE) 200 MG/ML injection Inject 200 mg into the skin 2 (two) times a week. 06/21/23   [provider]  valACYclovir  (VALTREX ) 500 MG tablet TAKE 1 TABLET (500 MG TOTAL) BY MOUTH 2 (TWO) TIMES DAILY. RASH ON HEAD 11/30/22   Emmy Justus DEL, MD    Family History Family History  Problem Relation Age of Onset   Cervical cancer Mother    Heart disease Father    Prostate cancer Father    Diabetes Sister    Sleep apnea Neg Hx     Social History Social History   Tobacco Use   Smoking status: Never   Smokeless tobacco: Never  Vaping Use   Vaping status: Never Used  Substance Use Topics   Alcohol use: Yes    Alcohol/week: 2.0 standard drinks of alcohol    Types: 2 Glasses of wine per week    Comment: occasional   Drug use: Never     Allergies   Patient has no known allergies.   Review of Systems Review of Systems  Constitutional: Negative.   Respiratory:  Negative for cough, chest tightness, shortness of breath and wheezing.   Gastrointestinal:  Positive for abdominal pain. Negative for constipation, diarrhea, nausea and vomiting.  Skin: Negative.      Physical Exam Triage Vital Signs ED Triage Vitals  Encounter Vitals Group     BP 08/08/24 1039 129/82     Girls Systolic BP Percentile --      Girls Diastolic BP Percentile --       Boys Systolic BP Percentile --      Boys Diastolic BP Percentile --      Pulse Rate 08/08/24 1039 60     Resp 08/08/24 1039 18     Temp 08/08/24 1039 98.6 F (37 C)     Temp src --      SpO2 08/08/24 1039 93 %     Weight --      Height --      Head Circumference --      Peak Flow --      Pain Score 08/08/24 1033 4     Pain Loc --      Pain Education --      Exclude  from Growth Chart --    No data found.  Updated Vital Signs BP 129/82   Pulse 60   Temp 98.6 F (37 C)   Resp 18   SpO2 93%   Visual Acuity Right Eye Distance:   Left Eye Distance:   Bilateral Distance:    Right Eye Near:   Left Eye Near:    Bilateral Near:     Physical Exam Vitals and nursing note reviewed.  Constitutional:      General: He is not in acute distress.    Appearance: Normal appearance. He is normal weight. He is not toxic-appearing.  Eyes:     Conjunctiva/sclera: Conjunctivae normal.  Cardiovascular:     Rate and Rhythm: Normal rate and regular rhythm.     Heart sounds: Normal heart sounds.  Pulmonary:     Effort: Pulmonary effort is normal.     Breath sounds: Normal breath sounds and air entry.  Chest:     Chest wall: Tenderness (Right ribs 9-10 extending from sternum to mid axillary) present. No deformity or swelling. There is no dullness to percussion.  Abdominal:     General: Abdomen is flat. Bowel sounds are normal.     Palpations: Abdomen is soft. There is no mass or pulsatile mass.     Tenderness: There is no abdominal tenderness. There is no right CVA tenderness, left CVA tenderness, guarding or rebound. Negative signs include Murphy's sign, Rovsing's sign, McBurney's sign, psoas sign and obturator sign.     Hernia: No hernia is present.  Lymphadenopathy:     Cervical:     Right cervical: No posterior cervical adenopathy.    Left cervical: No posterior cervical adenopathy.  Skin:    General: Skin is warm and dry.  Neurological:     Mental Status: He is alert and  oriented to person, place, and time.  Psychiatric:        Mood and Affect: Mood normal.        Behavior: Behavior normal.      UC Treatments / Results  Labs (all labs ordered are listed, but only abnormal results are displayed) Labs Reviewed - No data to display  EKG   Radiology No results found.  Procedures Procedures (including critical care time)  Medications Ordered in UC Medications - No data to display  Initial Impression / Assessment and Plan / UC Course  I have reviewed the triage vital signs and the nursing notes.  Pertinent labs & imaging results that were available during my care of the patient were reviewed by me and considered in my medical decision making (see chart for details).     Right-sided rib pain that is tender to touch without any overlying skin changes, trauma, or swelling.  Low concern for shingles as symptoms have been present for 2 weeks without rash present.  Abdomen soft nondistended nontender, with negative Murphy's so low suspicion for gallbladder etiology.  As symptoms are musculoskeletal in nature  (tender to palpation, worse with movement, alleviated by Tylenol,, worsened as day goes on) and diagnosing with musculoskeletal right sided rib pain.  Plan: Tylenol, methocarbamol  750 mg 4 times a day as needed, and lidocaine  patches.  Patient verbalized comfort and understanding with plan of care  Final diagnoses:  Rib pain on right side     Discharge Instructions        What is Lower Rib Pain? Lower rib pain can result from trauma (such as a fall or direct blow), muscle strain, or  less commonly, underlying medical conditions. In most cases, rib pain is due to bruising, muscle spasm, or minor rib fractures. Rib injuries are typically managed without surgery and heal over several weeks.  Expected Course - Rib pain can be significant and may last for several weeks, sometimes longer if the injury is severe. - Most rib injuries heal on their  own with supportive care. - Pain is usually worst in the first few days and gradually improves. - It is important to manage pain effectively to allow for deep breathing and coughing, which helps prevent lung complications.  Why is Pain Control Important? - Poorly controlled rib pain can lead to shallow breathing and reduced coughing, increasing the risk of pneumonia and other lung complications. - Adequate pain control helps maintain normal breathing and activity levels.  Home Care and Supportive Measures  Medications: - Acetaminophen (Tylenol): Take as directed for baseline pain control. Do not exceed the maximum daily dose (usually 3,000-4,000 mg per day, depending on age and liver function). - Lidocaine  Patch: Apply to the area of maximal pain as directed. This can help reduce localized pain and improve comfort. - Methocarbamol : This muscle relaxant can help relieve muscle spasm around the ribs, which is common after injury.  - Other Supportive Measures:   - Ice: Apply an ice pack to the area for 20 minutes at a time, several times a day, especially in the first 48 hours.   - Heat: After the first 48 hours, heat packs may help relax muscles and reduce discomfort.   - Positioning: Find a comfortable position for rest and sleep, often propped up with pillows.   - Coughing and Deep Breathing: Take slow, deep breaths and cough regularly to keep your lungs clear. Use a pillow to splint the area if needed.   - Activity: Stay as active as possible within comfort limits. Gentle walking is encouraged to prevent complications from immobility.  When to Seek Further Evaluation - Increasing shortness of breath, difficulty breathing, or chest pain at rest - Coughing up blood - Fever, chills, or signs of infection - Severe or worsening pain not controlled with the above measures - New or worsening swelling or bruising  Additional Notes - Healing may take several weeks. Persistent pain beyond 6-8  weeks should be reassessed. - Avoid activities that worsen pain or risk further injury until healing is complete.  Summary of Your Pain Management Plan:  - Scheduled acetaminophen (Tylenol) - Lidocaine  patch to the area of pain - Methocarbamol  for muscle spasm - Supportive measures: ice/heat, positioning, deep breathing, and gentle activity       ED Prescriptions     Medication Sig Dispense Auth. Provider   methocarbamol  (ROBAXIN ) 500 MG tablet Take 1 tablet (500 mg total) by mouth 2 (two) times daily. 20 tablet Leatrice Vernell HERO, NP   lidocaine  (LIDODERM ) 5 % Place 1 patch onto the skin daily. Remove & Discard patch within 12 hours or as directed by MD 30 patch Leatrice Vernell HERO, NP      PDMP not reviewed this encounter.   Leatrice Vernell HERO, NP 08/08/24 1124

## 2024-08-08 NOTE — Discharge Instructions (Signed)
   What is Lower Rib Pain? Lower rib pain can result from trauma (such as a fall or direct blow), muscle strain, or less commonly, underlying medical conditions. In most cases, rib pain is due to bruising, muscle spasm, or minor rib fractures. Rib injuries are typically managed without surgery and heal over several weeks.  Expected Course - Rib pain can be significant and may last for several weeks, sometimes longer if the injury is severe. - Most rib injuries heal on their own with supportive care. - Pain is usually worst in the first few days and gradually improves. - It is important to manage pain effectively to allow for deep breathing and coughing, which helps prevent lung complications.  Why is Pain Control Important? - Poorly controlled rib pain can lead to shallow breathing and reduced coughing, increasing the risk of pneumonia and other lung complications. - Adequate pain control helps maintain normal breathing and activity levels.  Home Care and Supportive Measures  Medications: - Acetaminophen (Tylenol): Take as directed for baseline pain control. Do not exceed the maximum daily dose (usually 3,000-4,000 mg per day, depending on age and liver function). - Lidocaine  Patch: Apply to the area of maximal pain as directed. This can help reduce localized pain and improve comfort. - Methocarbamol : This muscle relaxant can help relieve muscle spasm around the ribs, which is common after injury.  - Other Supportive Measures:   - Ice: Apply an ice pack to the area for 20 minutes at a time, several times a day, especially in the first 48 hours.   - Heat: After the first 48 hours, heat packs may help relax muscles and reduce discomfort.   - Positioning: Find a comfortable position for rest and sleep, often propped up with pillows.   - Coughing and Deep Breathing: Take slow, deep breaths and cough regularly to keep your lungs clear. Use a pillow to splint the area if needed.   - Activity:  Stay as active as possible within comfort limits. Gentle walking is encouraged to prevent complications from immobility.  When to Seek Further Evaluation - Increasing shortness of breath, difficulty breathing, or chest pain at rest - Coughing up blood - Fever, chills, or signs of infection - Severe or worsening pain not controlled with the above measures - New or worsening swelling or bruising  Additional Notes - Healing may take several weeks. Persistent pain beyond 6-8 weeks should be reassessed. - Avoid activities that worsen pain or risk further injury until healing is complete.  Summary of Your Pain Management Plan:  - Scheduled acetaminophen (Tylenol) - Lidocaine  patch to the area of pain - Methocarbamol  for muscle spasm - Supportive measures: ice/heat, positioning, deep breathing, and gentle activity

## 2024-08-08 NOTE — ED Triage Notes (Addendum)
 PT reports for 2 weeks he has had pain to RT upper ABD Pain. Pt reports The pain is worse when he lifts his RT arm . Pain radiates to mid ABD. PT took Tylenol with out relief.

## 2024-08-11 ENCOUNTER — Encounter: Payer: Self-pay | Admitting: Physician Assistant

## 2024-08-11 ENCOUNTER — Ambulatory Visit: Attending: Physician Assistant | Admitting: Physician Assistant

## 2024-08-11 VITALS — BP 132/80 | HR 61 | Ht 66.0 in | Wt 219.0 lb

## 2024-08-11 DIAGNOSIS — I10 Essential (primary) hypertension: Secondary | ICD-10-CM

## 2024-08-11 DIAGNOSIS — Z86718 Personal history of other venous thrombosis and embolism: Secondary | ICD-10-CM | POA: Diagnosis not present

## 2024-08-11 DIAGNOSIS — Z79899 Other long term (current) drug therapy: Secondary | ICD-10-CM | POA: Diagnosis not present

## 2024-08-11 DIAGNOSIS — I739 Peripheral vascular disease, unspecified: Secondary | ICD-10-CM

## 2024-08-11 DIAGNOSIS — I2510 Atherosclerotic heart disease of native coronary artery without angina pectoris: Secondary | ICD-10-CM | POA: Diagnosis not present

## 2024-08-11 DIAGNOSIS — E785 Hyperlipidemia, unspecified: Secondary | ICD-10-CM | POA: Diagnosis not present

## 2024-08-11 DIAGNOSIS — Z7901 Long term (current) use of anticoagulants: Secondary | ICD-10-CM | POA: Diagnosis not present

## 2024-08-11 NOTE — Patient Instructions (Signed)
 Medication Instructions:  Your physician recommends that you continue on your current medications as directed. Please refer to the Current Medication list given to you today. *If you need a refill on your cardiac medications before your next appointment, please call your pharmacy*  Lab Work: FASTING WITHIN A WEEK-LIPIDS & CMET If you have labs (blood work) drawn today and your tests are completely normal, you will receive your results only by: MyChart Message (if you have MyChart) OR A paper copy in the mail If you have any lab test that is abnormal or we need to change your treatment, we will call you to review the results.  Testing/Procedures: NONE ORDERED  Follow-Up: At [facility], you and your health needs are our priority.  As part of our continuing mission to provide you with exceptional heart care, our providers are all part of one team.  This team includes your primary Cardiologist (physician) and Advanced Practice Providers or APPs (Physician Assistants and Nurse Practitioners) who all work together to provide you with the care you need, when you need it.  Your next appointment:   6 month(s)  Provider:   Jerel Balding, MD or Orren Fabry, [place]  We recommend signing up for the patient portal called MyChart.  Sign up information is provided on this After Visit Summary.  MyChart is used to connect with patients for Virtual Visits (Telemedicine).  Patients are able to view lab/test results, encounter notes, upcoming appointments, etc.  Non-urgent messages can be sent to your provider as well.   To learn more about what you can do with MyChart, go to [email or web address].   Other Instructions

## 2024-08-11 NOTE — Progress Notes (Signed)
 Cardiology Office Note:  .   Date:  08/11/2024  ID:  Justin Mueller, DOB 09/04/1950, MRN [id number] PCP: Moishe Chiquita HERO, NP  [facility] Providers Cardiologist:  None {    History of Present Illness: .   Justin Mueller is a 73 y.o. male with a past medical history of CAD, PVD, antiphospholipid antibody, HTN, HLD, type 2 diabetes mellitus who is here for overdue follow-up appointment.  Was seen for the first time by Dr. Okey follow-up 2022.  Initially from the ER.  Patient has history of CAD.  2018 a cardiac catheterization and is now status post PCI/DES x 2 to diagonal.  Moderate disease of the LAD at that time.  Back in 2022 he was at a different hospital for a pulmonary embolism (multiple segments) this occurred after his knee surgery.  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  On chronic Coumadin .  Per hematology needed Lovenox  bridging in future.  Still on chronic anticoagulation and had a right lower extremity DVT by ultrasound June 2022.  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  He was seen July 2023 and noted doing well at that time.  Breathing was okay and denies chest pain.  Exercising 3 days a week.  No shortness of breath.  His diet involved oatmeal and some fresh fruit for breakfast usually skips lunch and meat and corn for dinner.  Tries to limit visits to Coumadin .  I saw the patient 06/23/2023, he tells me that he has not had any issues with his heart to his knowledge. He told me he is still exercising a few days a week. He has not had any bleeding on the coumadin . No chest pain or SOB. He continues to watch his diet.  He has a hematologist that has been initiated and in a while.  Weight has been about the same but he wants to lose some still.  Reports no shortness of breath nor dyspnea on exertion. Reports no chest pain, pressure, or tightness. No edema, orthopnea, PND. Reports no palpitations.   I saw him 10/2023,  he presents  with a history of anticoagulation for DVT/PE  with a recent episode of vertigo. He describes it as a new symptom, which was severe enough to warrant an emergency room visit. The emergency room attributed the vertigo to an issue with the right ear.He has follow-up with neurology scheduled.   The patient's INR has been consistently therapeutic on Coumadin , and he denies any bleeding complications. He also denies any cardiac symptoms such as palpitations or chest pain.  The patient is due for lipid screening at his next primary care appointment in June. He is active, exercising regularly at [employer], and has a goal of living to 73 years old. He has a history of a right quadriceps tear from a fall, which limits his ability to jog.  Reports no shortness of breath nor dyspnea on exertion. Reports no chest pain, pressure, or tightness. No edema, orthopnea, PND. Reports no palpitations.   Discussed the use of AI scribe software for clinical note transcription with the patient, who gave verbal consent to proceed.  Today, he presents with a hx of atrial fibrillation who presents for Coumadin  management and routine follow-up.  He has no new cardiac symptoms, including chest pain, shortness of breath, or peripheral edema.  His blood pressure has been stable. He takes metformin  1000 mg twice daily for diabetes, with last A1c 7.5. His most recent lipid panel in 2024  showed LDL 58 and triglycerides 143, both at goal, and he is due for an updated panel.  Reports no shortness of breath nor dyspnea on exertion. Reports no chest pain, pressure, or tightness. No edema, orthopnea, PND. Reports no palpitations.   Discussed the use of AI scribe software for clinical note transcription with the patient, who gave verbal consent to proceed.  ROS: Pertinent ROS in HPI  Studies Reviewed: .        None.      Physical Exam:   VS:  BP 132/80   Pulse 61   Ht 5' 6 (1.676 m)   Wt 219 lb (99.3 kg)   SpO2 95%    BMI 35.35 kg/m    Wt Readings from Last 3 Encounters:  08/11/24 219 lb (99.3 kg)  06/13/24 210 lb (95.3 kg)  04/03/24 218 lb (98.9 kg)    GEN: Well nourished, well developed in no acute distress NECK: No JVD; No carotid bruits CARDIAC: RRR, no murmurs, rubs, gallops RESPIRATORY:  Clear to auscultation without rales, wheezing or rhonchi  ABDOMEN: Soft, non-tender, non-distended EXTREMITIES:  No edema; No deformity   ASSESSMENT AND PLAN: .    Anticoagulation management with lupus, positive beta-2 glycoprotein with hx of DVT/PE  No new issues with coronary artery disease. - Coordinated with the Coumadin  clinic for INR monitoring.  Coronary artery disease without angina No current symptoms of angina. No new cardiac symptoms reported. -continue current medications.  Essential hypertension Blood pressure is well-controlled at 130/80 mmHg. Discussed timing of medication administration to ensure accurate blood pressure readings. - Instructed to take blood pressure medication first thing in the morning and check blood pressure 1-2 hours later. - If blood pressure exceeds 165 mmHg, take clonidine. - Will consider increasing hydralazine dose if morning blood pressure remains high. - Referred to hypertension clinic for further management.  Hyperlipidemia LDL cholesterol is well-controlled at 58 mg/dL. Triglycerides are at 143 mg/dL, within target range. - Ordered lipid panel.  Type 2 diabetes mellitus A1c is elevated at 7.5%, above the target of less than 7%. Discussed potential need for additional medication if A1c remains elevated. - Coordinated with primary care for potential adjustment of diabetes management. - Will consider adding another medication if A1c remains elevated.   Dispo: He can follow-up in 6 months with Dr. Okey or Dr. Francyne  Signed, Orren LOISE Fabry, PA-C

## 2024-08-16 ENCOUNTER — Telehealth: Payer: Self-pay | Admitting: *Deleted

## 2024-08-16 ENCOUNTER — Other Ambulatory Visit: Payer: Self-pay | Admitting: *Deleted

## 2024-08-16 ENCOUNTER — Other Ambulatory Visit: Payer: Self-pay | Admitting: Internal Medicine

## 2024-08-16 DIAGNOSIS — Z86718 Personal history of other venous thrombosis and embolism: Secondary | ICD-10-CM

## 2024-08-16 DIAGNOSIS — Z86711 Personal history of pulmonary embolism: Secondary | ICD-10-CM

## 2024-08-16 DIAGNOSIS — Z7901 Long term (current) use of anticoagulants: Secondary | ICD-10-CM

## 2024-08-16 NOTE — Telephone Encounter (Signed)
 Called patient since INR is due; there was no answer and voicemail is full.

## 2024-08-17 ENCOUNTER — Other Ambulatory Visit: Payer: Self-pay

## 2024-08-17 ENCOUNTER — Ambulatory Visit (INDEPENDENT_AMBULATORY_CARE_PROVIDER_SITE_OTHER): Admitting: Internal Medicine

## 2024-08-17 ENCOUNTER — Ambulatory Visit: Payer: Self-pay | Admitting: Internal Medicine

## 2024-08-17 DIAGNOSIS — Z7901 Long term (current) use of anticoagulants: Secondary | ICD-10-CM

## 2024-08-17 DIAGNOSIS — Z86711 Personal history of pulmonary embolism: Secondary | ICD-10-CM

## 2024-08-17 DIAGNOSIS — Z86718 Personal history of other venous thrombosis and embolism: Secondary | ICD-10-CM

## 2024-08-17 DIAGNOSIS — Z5181 Encounter for therapeutic drug level monitoring: Secondary | ICD-10-CM

## 2024-08-17 LAB — PROTIME-INR
INR: 2.8 — ABNORMAL HIGH (ref 0.9–1.2)
Prothrombin Time: 28.4 s — ABNORMAL HIGH (ref 9.1–12.0)

## 2024-08-17 NOTE — Progress Notes (Signed)
 INR 2.8  Please see anticoagulation encounter Spoke with pt and instructed him to continue 8mg  daily. Recheck INR in 5 weeks at [facility].  Coumadin  Clinic [phone]

## 2024-09-05 ENCOUNTER — Encounter: Payer: Self-pay | Admitting: Neurology

## 2024-09-05 ENCOUNTER — Encounter: Admitting: Neurology

## 2024-09-20 ENCOUNTER — Telehealth: Payer: Self-pay | Admitting: *Deleted

## 2024-09-20 DIAGNOSIS — Z7901 Long term (current) use of anticoagulants: Secondary | ICD-10-CM

## 2024-09-20 DIAGNOSIS — Z86711 Personal history of pulmonary embolism: Secondary | ICD-10-CM

## 2024-09-20 DIAGNOSIS — Z86718 Personal history of other venous thrombosis and embolism: Secondary | ICD-10-CM

## 2024-09-20 LAB — PROTIME-INR
INR: 2.7 — ABNORMAL HIGH (ref 0.9–1.2)
Prothrombin Time: 29.4 s — ABNORMAL HIGH (ref 9.1–12.0)

## 2024-09-20 NOTE — Telephone Encounter (Signed)
 Called patient since INR is due; there was no answer and the voicemail is full. Will try back later.  INR order has been released.

## 2024-09-20 NOTE — Telephone Encounter (Signed)
 Spoke with patient and he states he will go this afternoon to have lab drawn.

## 2024-09-21 ENCOUNTER — Telehealth: Payer: Self-pay

## 2024-09-21 ENCOUNTER — Ambulatory Visit: Payer: Self-pay | Admitting: Internal Medicine

## 2024-09-21 ENCOUNTER — Ambulatory Visit (INDEPENDENT_AMBULATORY_CARE_PROVIDER_SITE_OTHER): Admitting: Cardiology

## 2024-09-21 DIAGNOSIS — Z5181 Encounter for therapeutic drug level monitoring: Secondary | ICD-10-CM

## 2024-09-21 DIAGNOSIS — Z86718 Personal history of other venous thrombosis and embolism: Secondary | ICD-10-CM

## 2024-09-21 DIAGNOSIS — Z7901 Long term (current) use of anticoagulants: Secondary | ICD-10-CM

## 2024-09-21 DIAGNOSIS — Z86711 Personal history of pulmonary embolism: Secondary | ICD-10-CM

## 2024-09-21 NOTE — Progress Notes (Signed)
 INR  2.7  Spoke with pt and instructed him to continue 8mg  daily. Recheck INR in 5 weeks at [facility].  Coumadin  Clinic [phone]

## 2024-09-21 NOTE — Telephone Encounter (Signed)
 Tried calling patient to discuss INR result, but mailbox is full.

## 2024-09-26 ENCOUNTER — Other Ambulatory Visit: Payer: Self-pay

## 2024-09-26 MED ORDER — MECLIZINE HCL 12.5 MG PO TABS: 12.5000 mg | ORAL_TABLET | Freq: Three times a day (TID) | ORAL | 0 refills | Status: AC | PRN
